# Patient Record
Sex: Male | Born: 1970 | Race: White | Hispanic: No | Marital: Married | Smoking: Former smoker
Health system: Southern US, Community
[De-identification: ages and names within clinical notes are randomized; demographics above are authoritative.]

## PROBLEM LIST (undated history)

## (undated) DIAGNOSIS — E119 Type 2 diabetes mellitus without complications: Secondary | ICD-10-CM

## (undated) DIAGNOSIS — F419 Anxiety disorder, unspecified: Secondary | ICD-10-CM

## (undated) DIAGNOSIS — I209 Angina pectoris, unspecified: Secondary | ICD-10-CM

## (undated) DIAGNOSIS — J45909 Unspecified asthma, uncomplicated: Secondary | ICD-10-CM

## (undated) HISTORY — PX: EYE SURGERY: SHX253

---

## 2000-06-30 ENCOUNTER — Encounter: Payer: Self-pay | Admitting: Internal Medicine

## 2000-06-30 ENCOUNTER — Emergency Department (HOSPITAL_COMMUNITY): Admission: EM | Admit: 2000-06-30 | Discharge: 2000-06-30 | Payer: Self-pay | Admitting: Internal Medicine

## 2010-11-08 ENCOUNTER — Ambulatory Visit: Payer: Self-pay

## 2012-10-08 ENCOUNTER — Emergency Department: Payer: Self-pay | Admitting: Emergency Medicine

## 2014-11-17 ENCOUNTER — Other Ambulatory Visit: Payer: Self-pay

## 2014-11-17 MED ORDER — FLUTICASONE-SALMETEROL 100-50 MCG/DOSE IN AEPB: 1.0000 | INHALATION_SPRAY | Freq: Two times a day (BID) | RESPIRATORY_TRACT | Status: DC

## 2014-11-17 NOTE — Telephone Encounter (Signed)
LAST VISIT: 05/10/2014  Request for Advair Diskus [id number].

## 2014-11-17 NOTE — Telephone Encounter (Signed)
Apt pe 

## 2014-11-18 NOTE — Telephone Encounter (Signed)
LM for pt to call and schedule appt. °

## 2014-12-27 ENCOUNTER — Other Ambulatory Visit: Payer: Self-pay

## 2014-12-27 MED ORDER — VENLAFAXINE HCL ER 225 MG PO TB24: 225.0000 mg | ORAL_TABLET | Freq: Every day | ORAL | Status: DC

## 2014-12-27 NOTE — Telephone Encounter (Signed)
Apt pe 

## 2014-12-27 NOTE — Telephone Encounter (Signed)
Last Visit: 05/10/2014 Practice Partner: [phone]  Request for Venlafaxine (Effexor) 225mg  tab.

## 2015-01-31 ENCOUNTER — Encounter: Payer: Self-pay | Admitting: Family Medicine

## 2015-04-12 ENCOUNTER — Ambulatory Visit
Admission: RE | Admit: 2015-04-12 | Discharge: 2015-04-12 | Disposition: A | Discharge status: Home / Self Care | Payer: Managed Care, Other (non HMO) | Source: Ambulatory Visit | Attending: Physician Assistant | Admitting: Physician Assistant

## 2015-04-12 ENCOUNTER — Encounter: Payer: Self-pay | Admitting: Physician Assistant

## 2015-04-12 ENCOUNTER — Ambulatory Visit: Payer: Self-pay | Admitting: Physician Assistant

## 2015-04-12 VITALS — BP 110/80 | HR 73 | Temp 98.5°F

## 2015-04-12 DIAGNOSIS — R0789 Other chest pain: Secondary | ICD-10-CM

## 2015-04-12 DIAGNOSIS — R079 Chest pain, unspecified: Secondary | ICD-10-CM | POA: Insufficient documentation

## 2015-04-12 NOTE — Progress Notes (Signed)
S: c/o left sided lung pain with deep cough/inspiration/and sneezing, feels like something is pressing in his lung, sx for 3 weeks, no sob, nonsmoker, hx of asthma, was in [place] and developed asthma after being there and breathing in a lot of sand, states they saw a spot on his lung a few years ago but the mri was neg; sees dr Meredeth Idefleming for pulmonology  O: vitals wnl, nad, lungs c t a, cv rrr, ribs nontender, abd nontender  A: chest pain on deep breath  P: cxr ap and lateral, refer to pulmonology if normal

## 2015-04-13 NOTE — Progress Notes (Signed)
I spoke with the patient about his xray results and he expressed understanding.  Patient will follow up with Dr. Meredeth IdeFleming, the pulmonologist at [facility].

## 2015-05-11 ENCOUNTER — Other Ambulatory Visit: Payer: Self-pay | Admitting: Physician Assistant

## 2015-05-12 NOTE — Telephone Encounter (Signed)
Med refill approved, pt has been on medication for long period of time without any difficulty

## 2015-07-15 ENCOUNTER — Encounter: Payer: Self-pay | Admitting: Physician Assistant

## 2015-07-15 ENCOUNTER — Ambulatory Visit: Payer: Self-pay | Admitting: Physician Assistant

## 2015-07-15 VITALS — BP 120/90 | HR 60 | Temp 98.4°F

## 2015-07-15 DIAGNOSIS — J018 Other acute sinusitis: Secondary | ICD-10-CM

## 2015-07-15 MED ORDER — AMOXICILLIN 875 MG PO TABS: 875.0000 mg | ORAL_TABLET | Freq: Two times a day (BID) | ORAL | Status: DC

## 2015-07-15 MED ORDER — FLUTICASONE PROPIONATE 50 MCG/ACT NA SUSP: 2.0000 | Freq: Every day | NASAL | Status: DC

## 2015-07-15 NOTE — Progress Notes (Signed)
S: C/o sore throat, chest and head congestion for 2 weeks, no fever, chills, cp/sob, v/d; mucus is green and thick, goes down the back of his throat,  cough is sporadic, c/o of facial and dental pain. Gets this every spring/summer and worried it'll get in his lungs bc he [place]  Using otc meds:   O: PE: vitals wnl, nad, perrl eomi, normocephalic, tms dull, nasal mucosa red and swollen, throat injected, neck supple no lymph, lungs c t a, cv rrr, neuro intact  A:  Acute sinusitis   P: amoxil 875mg  bid x 10d, flonase, drink fluids, continue regular meds , use otc meds of choice, return if not improving in 5 days, return earlier if worsening

## 2015-09-01 ENCOUNTER — Telehealth: Payer: Self-pay | Admitting: Family Medicine

## 2015-09-01 NOTE — Telephone Encounter (Signed)
FYI-Called to notify pt that he would need an appt to complete the paperwork for his disability but the number on file was incorrect. No message left. Called Angela Coy(spouse on HIPPA) no answer will try again.

## 2015-09-09 ENCOUNTER — Other Ambulatory Visit: Payer: Self-pay | Admitting: Physician Assistant

## 2015-09-09 NOTE — Telephone Encounter (Signed)
Med refill approved, pt has been on the medication for years for ptsd

## 2015-09-21 ENCOUNTER — Encounter: Payer: Self-pay | Admitting: Family Medicine

## 2015-09-21 ENCOUNTER — Ambulatory Visit (INDEPENDENT_AMBULATORY_CARE_PROVIDER_SITE_OTHER): Payer: Managed Care, Other (non HMO) | Admitting: Family Medicine

## 2015-09-21 ENCOUNTER — Ambulatory Visit: Payer: Managed Care, Other (non HMO) | Admitting: Family Medicine

## 2015-09-21 VITALS — BP 119/82 | HR 76 | Temp 98.1°F | Ht 70.5 in | Wt 234.0 lb

## 2015-09-21 DIAGNOSIS — F4312 Post-traumatic stress disorder, chronic: Secondary | ICD-10-CM

## 2015-09-21 NOTE — Progress Notes (Signed)
No visit done  patient for posttraumatic stress syndrome benefit [facility] requiring psychiatrist or psychologist fill out not a family physician.

## 2015-10-19 ENCOUNTER — Ambulatory Visit: Payer: Self-pay | Admitting: Physician Assistant

## 2015-10-19 ENCOUNTER — Other Ambulatory Visit: Payer: Self-pay | Admitting: Physician Assistant

## 2015-10-19 DIAGNOSIS — J4520 Mild intermittent asthma, uncomplicated: Secondary | ICD-10-CM

## 2015-10-19 MED ORDER — FLUTICASONE-SALMETEROL 100-50 MCG/DOSE IN AEPB: 1.0000 | INHALATION_SPRAY | Freq: Two times a day (BID) | RESPIRATORY_TRACT | 6 refills | Status: DC

## 2015-10-19 NOTE — Progress Notes (Addendum)
Sent rx in to pharmacy Pt not seen, stopped by clinic to get refill

## 2015-10-19 NOTE — Progress Notes (Deleted)
Pt requesting refill on advair, [facility]

## 2015-10-20 ENCOUNTER — Encounter: Payer: Self-pay | Admitting: Physician Assistant

## 2015-10-24 ENCOUNTER — Ambulatory Visit: Payer: Self-pay | Admitting: Physician Assistant

## 2015-10-24 ENCOUNTER — Encounter: Payer: Self-pay | Admitting: Physician Assistant

## 2015-10-24 ENCOUNTER — Ambulatory Visit
Admission: RE | Admit: 2015-10-24 | Discharge: 2015-10-24 | Disposition: A | Discharge status: Home / Self Care | Payer: Managed Care, Other (non HMO) | Source: Ambulatory Visit | Attending: Physician Assistant | Admitting: Physician Assistant

## 2015-10-24 VITALS — BP 120/85 | HR 75 | Temp 98.5°F

## 2015-10-24 DIAGNOSIS — R1010 Upper abdominal pain, unspecified: Secondary | ICD-10-CM | POA: Insufficient documentation

## 2015-10-24 DIAGNOSIS — I878 Other specified disorders of veins: Secondary | ICD-10-CM | POA: Diagnosis not present

## 2015-10-24 DIAGNOSIS — R109 Unspecified abdominal pain: Secondary | ICD-10-CM | POA: Diagnosis present

## 2015-10-24 MED ORDER — PROMETHAZINE HCL 25 MG PO TABS: 25.0000 mg | ORAL_TABLET | Freq: Three times a day (TID) | ORAL | 0 refills | Status: DC | PRN

## 2015-10-24 NOTE — Progress Notes (Signed)
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

## 2015-12-26 ENCOUNTER — Encounter: Payer: Self-pay | Admitting: Emergency Medicine

## 2015-12-26 ENCOUNTER — Observation Stay
Admission: EM | Admit: 2015-12-26 | Discharge: 2015-12-27 | Disposition: A | Discharge status: Home / Self Care | Payer: Managed Care, Other (non HMO) | Attending: Internal Medicine | Admitting: Internal Medicine

## 2015-12-26 ENCOUNTER — Emergency Department: Payer: Managed Care, Other (non HMO)

## 2015-12-26 DIAGNOSIS — G4733 Obstructive sleep apnea (adult) (pediatric): Secondary | ICD-10-CM | POA: Diagnosis not present

## 2015-12-26 DIAGNOSIS — G4700 Insomnia, unspecified: Secondary | ICD-10-CM | POA: Diagnosis not present

## 2015-12-26 DIAGNOSIS — Z7982 Long term (current) use of aspirin: Secondary | ICD-10-CM | POA: Insufficient documentation

## 2015-12-26 DIAGNOSIS — I10 Essential (primary) hypertension: Secondary | ICD-10-CM | POA: Insufficient documentation

## 2015-12-26 DIAGNOSIS — F419 Anxiety disorder, unspecified: Secondary | ICD-10-CM | POA: Diagnosis not present

## 2015-12-26 DIAGNOSIS — Z833 Family history of diabetes mellitus: Secondary | ICD-10-CM | POA: Insufficient documentation

## 2015-12-26 DIAGNOSIS — Z820 Family history of epilepsy and other diseases of the nervous system: Secondary | ICD-10-CM | POA: Diagnosis not present

## 2015-12-26 DIAGNOSIS — Z79899 Other long term (current) drug therapy: Secondary | ICD-10-CM | POA: Insufficient documentation

## 2015-12-26 DIAGNOSIS — R079 Chest pain, unspecified: Secondary | ICD-10-CM | POA: Diagnosis present

## 2015-12-26 DIAGNOSIS — I200 Unstable angina: Secondary | ICD-10-CM | POA: Diagnosis not present

## 2015-12-26 DIAGNOSIS — E785 Hyperlipidemia, unspecified: Secondary | ICD-10-CM | POA: Diagnosis not present

## 2015-12-26 DIAGNOSIS — F4310 Post-traumatic stress disorder, unspecified: Secondary | ICD-10-CM | POA: Insufficient documentation

## 2015-12-26 DIAGNOSIS — Z8379 Family history of other diseases of the digestive system: Secondary | ICD-10-CM | POA: Insufficient documentation

## 2015-12-26 DIAGNOSIS — J449 Chronic obstructive pulmonary disease, unspecified: Secondary | ICD-10-CM | POA: Insufficient documentation

## 2015-12-26 DIAGNOSIS — Z87891 Personal history of nicotine dependence: Secondary | ICD-10-CM | POA: Insufficient documentation

## 2015-12-26 DIAGNOSIS — I517 Cardiomegaly: Secondary | ICD-10-CM | POA: Insufficient documentation

## 2015-12-26 HISTORY — DX: Anxiety disorder, unspecified: F41.9

## 2015-12-26 LAB — BASIC METABOLIC PANEL
ANION GAP: 7 (ref 5–15)
BUN: 12 mg/dL (ref 6–20)
CALCIUM: 8.7 mg/dL — AB (ref 8.9–10.3)
CO2: 26 mmol/L (ref 22–32)
Chloride: 105 mmol/L (ref 101–111)
Creatinine, Ser: 1.03 mg/dL (ref 0.61–1.24)
GLUCOSE: 141 mg/dL — AB (ref 65–99)
Potassium: 3.5 mmol/L (ref 3.5–5.1)
Sodium: 138 mmol/L (ref 135–145)

## 2015-12-26 LAB — CBC
HCT: 43.1 % (ref 40.0–52.0)
HEMOGLOBIN: 14.7 g/dL (ref 13.0–18.0)
MCH: 30.1 pg (ref 26.0–34.0)
MCHC: 34.2 g/dL (ref 32.0–36.0)
MCV: 88 fL (ref 80.0–100.0)
PLATELETS: 257 10*3/uL (ref 150–440)
RBC: 4.89 MIL/uL (ref 4.40–5.90)
RDW: 13.1 % (ref 11.5–14.5)
WBC: 7.5 10*3/uL (ref 3.8–10.6)

## 2015-12-26 LAB — TROPONIN I: Troponin I: <0.03 ng/mL (ref <0.03)

## 2015-12-26 MED ORDER — QUETIAPINE FUMARATE 25 MG PO TABS
200.0000 mg | ORAL_TABLET | Freq: Every day | ORAL | Status: DC
  Administered 2015-12-26: 200 mg via ORAL
  Filled 2015-12-26: qty 8

## 2015-12-26 MED ORDER — ONDANSETRON HCL 4 MG/2ML IJ SOLN: 4.0000 mg | Freq: Four times a day (QID) | INTRAMUSCULAR | Status: DC | PRN

## 2015-12-26 MED ORDER — QUETIAPINE FUMARATE 25 MG PO TABS
200.0000 mg | ORAL_TABLET | Freq: Every day | ORAL | Status: DC
  Filled 2015-12-26: qty 8

## 2015-12-26 MED ORDER — LAMOTRIGINE 100 MG PO TABS
100.0000 mg | ORAL_TABLET | Freq: Two times a day (BID) | ORAL | Status: DC
  Administered 2015-12-26 – 2015-12-27 (×2): 100 mg via ORAL
  Filled 2015-12-26 (×2): qty 1

## 2015-12-26 MED ORDER — ASPIRIN 81 MG PO CHEW
324.0000 mg | CHEWABLE_TABLET | Freq: Once | ORAL | Status: AC
  Administered 2015-12-26: 324 mg via ORAL
  Filled 2015-12-26: qty 4

## 2015-12-26 MED ORDER — MOMETASONE FURO-FORMOTEROL FUM 100-5 MCG/ACT IN AERO
2.0000 | INHALATION_SPRAY | Freq: Two times a day (BID) | RESPIRATORY_TRACT | Status: DC
  Administered 2015-12-26 – 2015-12-27 (×2): 2 via RESPIRATORY_TRACT
  Filled 2015-12-26: qty 8.8

## 2015-12-26 MED ORDER — ASPIRIN 81 MG PO CHEW
CHEWABLE_TABLET | ORAL | Status: AC
  Filled 2015-12-26: qty 1

## 2015-12-26 MED ORDER — VENLAFAXINE HCL ER 75 MG PO CP24
75.0000 mg | ORAL_CAPSULE | Freq: Every day | ORAL | Status: DC
  Administered 2015-12-26: 75 mg via ORAL
  Filled 2015-12-26: qty 1

## 2015-12-26 MED ORDER — INFLUENZA VAC SPLIT QUAD 0.5 ML IM SUSY
0.5000 mL | PREFILLED_SYRINGE | INTRAMUSCULAR | Status: AC
  Administered 2015-12-27: 0.5 mL via INTRAMUSCULAR
  Filled 2015-12-26: qty 0.5

## 2015-12-26 MED ORDER — ACETAMINOPHEN 325 MG PO TABS
650.0000 mg | ORAL_TABLET | ORAL | Status: DC | PRN
  Administered 2015-12-27: 650 mg via ORAL
  Filled 2015-12-26: qty 2

## 2015-12-26 MED ORDER — ENOXAPARIN SODIUM 40 MG/0.4ML ~~LOC~~ SOLN
40.0000 mg | SUBCUTANEOUS | Status: DC
  Administered 2015-12-26: 40 mg via SUBCUTANEOUS
  Filled 2015-12-26: qty 0.4

## 2015-12-26 NOTE — Progress Notes (Signed)
Admitted from the ED. C/o chest tightness and dizziness, tingling sensation in the lips, knees are shaking.

## 2015-12-26 NOTE — H&P (Signed)
[facility] [place] [facility]   PATIENT NAME: Tyler ArbourJeff Deleon    MR#:  [id number]  DATE OF BIRTH:  Aug 26, 1970  DATE OF ADMISSION:  12/26/2015  PRIMARY CARE PHYSICIAN: Greig RightSusan Fisher, PA-C   REQUESTING/REFERRING PHYSICIAN: dr Don Perkingveronese  CHIEF COMPLAINT:   Chest painOn and off for 1 week HISTORY OF PRESENT ILLNESS:  Tyler Deleon  is a 45 y.o. male with a known history of PTSD comes to the emergency room with chest pain and left lateral radiating to the neck for 1 week. Patient thought initially it was a muscle pull however the pain persisted came back and came to the emergency room for further evaluation. In the ER facet of cardiac enzyme was negative EKG shows normal sinus rhythm. Patient denies doing any heavy lifting or any pushing. Patient had normal Myoview stress test in 2015.  PAST MEDICAL HISTORY:   Past Medical History:  Diagnosis Date  . Anxiety     PAST SURGICAL HISTOIRY:   Past Surgical History:  Procedure Laterality Date  . EYE SURGERY Bilateral    cataract removals    SOCIAL HISTORY:   Social History  Substance Use Topics  . Smoking status: Former Smoker    Types: Cigarettes  . Smokeless tobacco: Current User    Types: Snuff     Comment: was an occasional smoker, never a pack a day smoker  . Alcohol use No    FAMILY HISTORY:   Family History  Problem Relation Age of Onset  . Diabetes Mother   . Hepatitis C Mother   . Multiple sclerosis Mother     DRUG ALLERGIES:   Allergies  Allergen Reactions  . Bupropion Other (See Comments)    Increased anger     REVIEW OF SYSTEMS:  Review of Systems  Constitutional: Negative for chills, fever and weight loss.  HENT: Negative for ear discharge, ear pain and nosebleeds.   Eyes: Negative for blurred vision, pain and discharge.  Respiratory: Negative for sputum production, shortness of breath, wheezing and stridor.   Cardiovascular: Positive for chest pain. Negative for  palpitations, orthopnea and PND.  Gastrointestinal: Negative for abdominal pain, diarrhea, nausea and vomiting.  Genitourinary: Negative for frequency and urgency.  Musculoskeletal: Negative for back pain and joint pain.  Neurological: Negative for sensory change, speech change, focal weakness and weakness.  Psychiatric/Behavioral: Negative for depression and hallucinations. The patient is not nervous/anxious.      MEDICATIONS AT HOME:   Prior to Admission medications   Medication Sig Start Date End Date Taking? Authorizing Provider  Fluticasone-Salmeterol (ADVAIR DISKUS) 100-50 MCG/DOSE AEPB Inhale 1 puff into the lungs 2 (two) times daily. 10/19/15  Yes Faythe GheeSusan W Fisher, PA-C  lamoTRIgine (LAMICTAL) 100 MG tablet Take 100 mg by mouth 2 (two) times daily.   Yes Historical Provider, MD  QUEtiapine (SEROQUEL) 200 MG tablet TAKE ONE (1) TABLET BY MOUTH EVERY DAY 05/12/15  Yes Faythe GheeSusan W Fisher, PA-C  venlafaxine XR (EFFEXOR-XR) 75 MG 24 hr capsule TAKE THREE CAPSULES BY MOUTH DAILY AS DIRECTED 09/09/15  Yes Faythe GheeSusan W Fisher, PA-C      VITAL SIGNS:  Blood pressure (!) 135/95, pulse 62, temperature 98.2 F (36.8 C), temperature source Oral, resp. rate 16, height 5\' 11"  (1.803 m), weight 105.7 kg (233 lb), SpO2 98 %.  PHYSICAL EXAMINATION:  GENERAL:  45 y.o.-year-old patient lying in the bed with no acute distress.  EYES: Pupils equal, round, reactive to light and accommodation. No scleral icterus. Extraocular muscles  intact.  HEENT: Head atraumatic, normocephalic. Oropharynx and nasopharynx clear.  NECK:  Supple, no jugular venous distention. No thyroid enlargement, no tenderness.  LUNGS: Normal breath sounds bilaterally, no wheezing, rales,rhonchi or crepitation. No use of accessory muscles of respiration.  CARDIOVASCULAR: S1, S2 normal. No murmurs, rubs, or gallops.  ABDOMEN: Soft, nontender, nondistended. Bowel sounds present. No organomegaly or mass.  EXTREMITIES: No pedal edema, cyanosis, or  clubbing.  NEUROLOGIC: Cranial nerves II through XII are intact. Muscle strength 5/5 in all extremities. Sensation intact. Gait not checked.  PSYCHIATRIC: patient is alert and oriented x 3.  SKIN: No obvious rash, lesion, or ulcer.   LABORATORY PANEL:   CBC  Recent Labs Lab 12/26/15 1012  WBC 7.5  HGB 14.7  HCT 43.1  PLT 257   ------------------------------------------------------------------------------------------------------------------  Chemistries   Recent Labs Lab 12/26/15 1012  NA 138  K 3.5  CL 105  CO2 26  GLUCOSE 141*  BUN 12  CREATININE 1.03  CALCIUM 8.7*   ------------------------------------------------------------------------------------------------------------------  Cardiac Enzymes  Recent Labs Lab 12/26/15 1012  TROPONINI <0.03   ------------------------------------------------------------------------------------------------------------------  RADIOLOGY:  Dg Chest 2 View  Result Date: 12/26/2015 CLINICAL DATA:  Chest pain. EXAM: CHEST  2 VIEW COMPARISON:  04/12/2015. FINDINGS: Mediastinum and hilar structures are normal. No focal infiltrate. Cardiomegaly with normal pulmonary vascularity. No pleural effusion or pneumothorax. IMPRESSION: No acute cardiopulmonary disease. Electronically Signed   By: Maisie Fushomas  Register   On: 12/26/2015 10:48    EKG:  Normal sinus rhythm  IMPRESSION AND PLAN:  Tyler Deleon  is a 45 y.o. male with a known history of PTSD comes to the emergency room with chest pain and left lateral radiating to the neck for 1 week. Patient thought initially it was a muscle pull however the pain persisted came back and came to the emergency room for further evaluation. In the ER facet of cardiac enzyme was negative EKG shows normal sinus rhythm.  1. Chest pain rule out MI -Admit to telemetry -Cycle cardiac enzymes 3. Cardiology consultation. -aspirin when necessary nitroglycerin  2. History of PTSD Continue home meds  3. DVT  prophylaxis subcutaneous Lovenox  Case was discussed with cardiology Dr. Kirke CorinArida     All the records are reviewed and case discussed with ED provider. Management plans discussed with the patient, family and they are in agreement.  CODE STATUS: full  TOTAL TIME TAKING CARE OF THIS PATIENT: 50 minutes.    Jamaris Theard M.D on 12/26/2015 at 3:43 PM  Between 7am to 6pm - Pager - [phone]  After 6pm go to [email or web address] - password [email or web address] [facility]  [facility]  Office  [phone]  CC: Primary care physician; Greig RightSusan Fisher, PA-C

## 2015-12-26 NOTE — ED Notes (Signed)
Pt dropped 1 Asprin into floor, override from pyxis

## 2015-12-26 NOTE — ED Triage Notes (Signed)
Pt started with left chest pain 1 week ago. Has also had SHOB, nausea, sweating, and fatigue. No distress at this time. Skin dry at this time.

## 2015-12-26 NOTE — Progress Notes (Signed)
States he's short of breath from just moving in the bed.

## 2015-12-26 NOTE — ED Provider Notes (Signed)
[facility] Emergency Department Provider Note  ____________________________________________  Time seen: Approximately 2:59 PM  I have reviewed the triage vital signs and the nursing notes.   HISTORY  Chief Complaint Chest Pain   HPI Tyler Deleon is a 45 y.o. male no significant past medical history who presents for evaluation of chest pain. Patient reports that for the last month he has been having progressively longer and worse episodes of chest pain on exertion. Over the course of the last few days he reports that the chest pain comes on with minimal exertion such as walking to and from his car. Describes as a tightness, located substernally, nonradiating, associated with dizziness, shortness of breath, nausea, diaphoresis. His last episode was 3 days ago. Patient has a history of ischemic heart disease on his grandmother, patient is not a smoker. He denies any chest pain at this time. He does report that he felt short of breath walking from triage to the room. He denies any recent illness, he denies coughing. Patient was seen by cardiology in 2015 for similar episodes and at that time had a stress test that was negative. Patient never underwent a left heart catheterization.  Past Medical History:  Diagnosis Date  . Anxiety     There are no active problems to display for this patient.   Past Surgical History:  Procedure Laterality Date  . EYE SURGERY Bilateral    cataract removals    Prior to Admission medications   Medication Sig Start Date End Date Taking? Authorizing Provider  Fluticasone-Salmeterol (ADVAIR DISKUS) 100-50 MCG/DOSE AEPB Inhale 1 puff into the lungs 2 (two) times daily. 10/19/15   Faythe GheeSusan W Fisher, PA-C  lamoTRIgine (LAMICTAL) 100 MG tablet Take 100 mg by mouth 2 (two) times daily.    Historical Provider, MD  promethazine (PHENERGAN) 25 MG tablet Take 1 tablet (25 mg total) by mouth every 8 (eight) hours as needed for nausea or vomiting.  10/24/15   Faythe GheeSusan W Fisher, PA-C  QUEtiapine (SEROQUEL) 200 MG tablet TAKE ONE (1) TABLET BY MOUTH EVERY DAY 05/12/15   Faythe GheeSusan W Fisher, PA-C  venlafaxine XR (EFFEXOR-XR) 75 MG 24 hr capsule TAKE THREE CAPSULES BY MOUTH DAILY AS DIRECTED 09/09/15   Faythe GheeSusan W Fisher, PA-C    Allergies Bupropion  Family History  Problem Relation Age of Onset  . Diabetes Mother   . Hepatitis C Mother   . Multiple sclerosis Mother     Social History Social History  Substance Use Topics  . Smoking status: Former Smoker    Types: Cigarettes  . Smokeless tobacco: Current User    Types: Snuff     Comment: was an occasional smoker, never a pack a day smoker  . Alcohol use No    Review of Systems  Constitutional: Negative for fever. Eyes: Negative for visual changes. ENT: Negative for sore throat. Neck: No neck pain  Cardiovascular: + chest pain and diaphoresis Respiratory: Negative for shortness of breath. Gastrointestinal: Negative for abdominal pain, vomiting or diarrhea. Genitourinary: Negative for dysuria. Musculoskeletal: Negative for back pain. Skin: Negative for rash. Neurological: Negative for headaches, weakness or numbness. Psych: No SI or HI  ____________________________________________   PHYSICAL EXAM:  VITAL SIGNS: ED Triage Vitals  Enc Vitals Group     BP 12/26/15 1011 (!) 144/89     Pulse Rate 12/26/15 1011 75     Resp 12/26/15 1011 18     Temp 12/26/15 1011 98.2 F (36.8 C)  Temp Source 12/26/15 1011 Oral     SpO2 12/26/15 1011 97 %     Weight 12/26/15 1007 233 lb (105.7 kg)     Height 12/26/15 1007 5\' 11"  (1.803 m)     Head Circumference --      Peak Flow --      Pain Score 12/26/15 1007 2     Pain Loc --      Pain Edu? --      Excl. in GC? --     Constitutional: Alert and oriented. Well appearing and in no apparent distress. HEENT:      Head: Normocephalic and atraumatic.         Eyes: Conjunctivae are normal. Sclera is non-icteric. EOMI. PERRL       Mouth/Throat: Mucous membranes are moist.       Neck: Supple with no signs of meningismus. Cardiovascular: Regular rate and rhythm. No murmurs, gallops, or rubs. 2+ symmetrical distal pulses are present in all extremities. No JVD. Respiratory: Normal respiratory effort. Lungs are clear to auscultation bilaterally. No wheezes, crackles, or rhonchi.  Gastrointestinal: Soft, non tender, and non distended with positive bowel sounds. No rebound or guarding. Musculoskeletal: Nontender with normal range of motion in all extremities. No edema, cyanosis, or erythema of extremities. Neurologic: Normal speech and language. Face is symmetric. Moving all extremities. No gross focal neurologic deficits are appreciated. Skin: Skin is warm, dry and intact. No rash noted. Psychiatric: Mood and affect are normal. Speech and behavior are normal.  ____________________________________________   LABS (all labs ordered are listed, but only abnormal results are displayed)  Labs Reviewed  BASIC METABOLIC PANEL - Abnormal; Notable for the following:       Result Value   Glucose, Bld 141 (*)    Calcium 8.7 (*)    All other components within normal limits  CBC  TROPONIN I  TROPONIN I   ____________________________________________  EKG  ED ECG REPORT I, Nita Sicklearolina Kelii Chittum, the attending physician, personally viewed and interpreted this ECG.  Normal sinus rhythm, rate of 76, normal intervals, normal axis, no ST elevations or depressions. Normal EKG. ____________________________________________  RADIOLOGY  CXR:  Negative ____________________________________________   PROCEDURES  Procedure(s) performed: None Procedures Critical Care performed:  None ____________________________________________   INITIAL IMPRESSION / ASSESSMENT AND PLAN / ED COURSE  45 y.o. male no significant past medical history who presents for evaluation of multiple exertional episodes of chest tightness associated with  diaphoresis, nausea, and SOB. No episodes at rest. Heart score of 4. No pain at this time. Presentation concerning for unstable angina with increase in frequency of episodes. EKG non ischemic, 1st troponin is negative. Will give full dose ASA and admit for chest pain work up.   Clinical Course     Pertinent labs & imaging results that were available during my care of the patient were reviewed by me and considered in my medical decision making (see chart for details).    ____________________________________________   FINAL CLINICAL IMPRESSION(S) / ED DIAGNOSES  Final diagnoses:  Unstable angina (HCC)      NEW MEDICATIONS STARTED DURING THIS VISIT:  New Prescriptions   No medications on file     Note:  This document was prepared using Dragon voice recognition software and may include unintentional dictation errors.    Nita Sicklearolina Tresean Mattix, MD 12/26/15 1504

## 2015-12-27 ENCOUNTER — Observation Stay (HOSPITAL_BASED_OUTPATIENT_CLINIC_OR_DEPARTMENT_OTHER)
Admit: 2015-12-27 | Discharge: 2015-12-27 | Disposition: A | Discharge status: Home / Self Care | Payer: Managed Care, Other (non HMO) | Attending: Internal Medicine | Admitting: Internal Medicine

## 2015-12-27 ENCOUNTER — Encounter
Admission: EM | Disposition: A | Discharge status: Home / Self Care | Payer: Self-pay | Source: Home / Self Care | Attending: Emergency Medicine

## 2015-12-27 ENCOUNTER — Other Ambulatory Visit: Payer: Managed Care, Other (non HMO)

## 2015-12-27 DIAGNOSIS — I200 Unstable angina: Principal | ICD-10-CM

## 2015-12-27 DIAGNOSIS — R079 Chest pain, unspecified: Secondary | ICD-10-CM | POA: Diagnosis not present

## 2015-12-27 HISTORY — PX: CARDIAC CATHETERIZATION: SHX172

## 2015-12-27 LAB — LIPID PANEL
Cholesterol: 171 mg/dL (ref 0–200)
HDL: 37 mg/dL — ABNORMAL LOW (ref >40)
LDL CALC: 108 mg/dL — AB (ref 0–99)
Total CHOL/HDL Ratio: 4.6 RATIO
Triglycerides: 128 mg/dL (ref <150)
VLDL: 26 mg/dL (ref 0–40)

## 2015-12-27 LAB — TROPONIN I

## 2015-12-27 LAB — ECHOCARDIOGRAM COMPLETE
HEIGHTINCHES: 71 in
WEIGHTICAEL: 3772.8 [oz_av]

## 2015-12-27 SURGERY — LEFT HEART CATH AND CORONARY ANGIOGRAPHY
Anesthesia: Moderate Sedation

## 2015-12-27 MED ORDER — MIDAZOLAM HCL 2 MG/2ML IJ SOLN
INTRAMUSCULAR | Status: AC
  Filled 2015-12-27: qty 2

## 2015-12-27 MED ORDER — ASPIRIN 81 MG PO CHEW: 81.0000 mg | CHEWABLE_TABLET | ORAL | Status: DC

## 2015-12-27 MED ORDER — SODIUM CHLORIDE 0.9% FLUSH: 3.0000 mL | INTRAVENOUS | Status: DC | PRN

## 2015-12-27 MED ORDER — SODIUM CHLORIDE 0.9 % WEIGHT BASED INFUSION: 3.0000 mL/kg/h | INTRAVENOUS | Status: DC

## 2015-12-27 MED ORDER — ASPIRIN 81 MG PO CHEW: 81.0000 mg | CHEWABLE_TABLET | Freq: Every day | ORAL | 0 refills | Status: DC

## 2015-12-27 MED ORDER — VERAPAMIL HCL 2.5 MG/ML IV SOLN
INTRAVENOUS | Status: DC | PRN
  Administered 2015-12-27: 2.5 mg via INTRAVENOUS

## 2015-12-27 MED ORDER — ISOSORBIDE MONONITRATE ER 30 MG PO TB24
30.0000 mg | ORAL_TABLET | Freq: Every day | ORAL | Status: DC
  Administered 2015-12-27: 30 mg via ORAL
  Filled 2015-12-27: qty 1

## 2015-12-27 MED ORDER — FUROSEMIDE 20 MG PO TABS: 20.0000 mg | ORAL_TABLET | Freq: Every day | ORAL | 0 refills | Status: DC

## 2015-12-27 MED ORDER — SODIUM CHLORIDE 0.9% FLUSH: 3.0000 mL | Freq: Two times a day (BID) | INTRAVENOUS | Status: DC

## 2015-12-27 MED ORDER — FENTANYL CITRATE (PF) 100 MCG/2ML IJ SOLN
INTRAMUSCULAR | Status: DC | PRN
  Administered 2015-12-27: 50 ug via INTRAVENOUS

## 2015-12-27 MED ORDER — ISOSORBIDE MONONITRATE ER 30 MG PO TB24: 30.0000 mg | ORAL_TABLET | Freq: Every day | ORAL | 0 refills | Status: DC

## 2015-12-27 MED ORDER — FENTANYL CITRATE (PF) 100 MCG/2ML IJ SOLN
INTRAMUSCULAR | Status: AC
  Filled 2015-12-27: qty 2

## 2015-12-27 MED ORDER — SODIUM CHLORIDE 0.9 % IV SOLN: 250.0000 mL | INTRAVENOUS | Status: DC | PRN

## 2015-12-27 MED ORDER — ASPIRIN 81 MG PO CHEW
CHEWABLE_TABLET | ORAL | Status: AC
  Filled 2015-12-27: qty 1

## 2015-12-27 MED ORDER — ASPIRIN 81 MG PO CHEW
81.0000 mg | CHEWABLE_TABLET | Freq: Every day | ORAL | Status: DC
  Administered 2015-12-27: 81 mg via ORAL
  Filled 2015-12-27: qty 1

## 2015-12-27 MED ORDER — SODIUM CHLORIDE 0.9 % IV SOLN
250.0000 mL | INTRAVENOUS | Status: DC | PRN
  Administered 2015-12-27: 250 mL via INTRAVENOUS

## 2015-12-27 MED ORDER — HEPARIN SODIUM (PORCINE) 1000 UNIT/ML IJ SOLN
INTRAMUSCULAR | Status: DC | PRN
  Administered 2015-12-27: 5000 [IU] via INTRAVENOUS

## 2015-12-27 MED ORDER — VENLAFAXINE HCL ER 75 MG PO CP24: 225.0000 mg | ORAL_CAPSULE | Freq: Every day | ORAL | Status: DC

## 2015-12-27 MED ORDER — HEPARIN (PORCINE) IN NACL 2-0.9 UNIT/ML-% IJ SOLN
INTRAMUSCULAR | Status: AC
  Filled 2015-12-27: qty 1000

## 2015-12-27 MED ORDER — HEPARIN SODIUM (PORCINE) 1000 UNIT/ML IJ SOLN
INTRAMUSCULAR | Status: AC
  Filled 2015-12-27: qty 1

## 2015-12-27 MED ORDER — MIDAZOLAM HCL 2 MG/2ML IJ SOLN
INTRAMUSCULAR | Status: DC | PRN
Start: 2015-12-27 — End: 2015-12-27
  Administered 2015-12-27 (×2): 1 mg via INTRAVENOUS

## 2015-12-27 MED ORDER — IOPAMIDOL (ISOVUE-300) INJECTION 61%
INTRAVENOUS | Status: DC | PRN
  Administered 2015-12-27: 75 mL via INTRA_ARTERIAL

## 2015-12-27 MED ORDER — SODIUM CHLORIDE 0.9 % WEIGHT BASED INFUSION: 1.0000 mL/kg/h | INTRAVENOUS | Status: DC

## 2015-12-27 MED ORDER — ASPIRIN 81 MG PO CHEW
CHEWABLE_TABLET | ORAL | Status: DC | PRN
  Administered 2015-12-27: 81 mg via ORAL

## 2015-12-27 MED ORDER — SODIUM CHLORIDE 0.9 % IV SOLN
INTRAVENOUS | Status: DC
  Administered 2015-12-27: 15:00:00 via INTRAVENOUS

## 2015-12-27 MED ORDER — VERAPAMIL HCL 2.5 MG/ML IV SOLN
INTRAVENOUS | Status: AC
  Filled 2015-12-27: qty 2

## 2015-12-27 SURGICAL SUPPLY — 6 items
CATH 5F 110X4 TIG (CATHETERS) ×3 IMPLANT
CATH INFINITI 5FR ANG PIGTAIL (CATHETERS) ×3 IMPLANT
DEVICE RAD TR BAND REGULAR (VASCULAR PRODUCTS) ×3 IMPLANT
GLIDESHEATH SLEND SS 6F .021 (SHEATH) ×3 IMPLANT
KIT MANI 3VAL PERCEP (MISCELLANEOUS) ×3 IMPLANT
PACK CARDIAC CATH (CUSTOM PROCEDURE TRAY) ×3 IMPLANT

## 2015-12-27 NOTE — Progress Notes (Signed)
Pt states he wears a CPAP at home to sleep. No orders in, MD pagde, Dr. Emmit PomfretHugelmeyer to put in CPAP order. No further concerns, will continue to monitor.Shirley FriarAlexis Miller, RN, BSN

## 2015-12-27 NOTE — Progress Notes (Signed)
*  PRELIMINARY RESULTS* Echocardiogram 2D Echocardiogram has been performed.  Cristela BlueHege, Erminio Nygard 12/27/2015, 1:37 PM

## 2015-12-27 NOTE — Progress Notes (Signed)
Report to [place] tele[place].  Keep right wrist elevated on pillow above the heart for today.  Watch right wrist for evidence of bleeding or hematoma.. If bleeding or hematoma noted, hold pressure over the site for at least 15 minutes and notify the physician.  No blending or flexing of the wrist--no lifting for the remainder of the day or for 2 weeks after your procedure. Notify the physician for evidence of infection or if you get a temperature.

## 2015-12-27 NOTE — Consult Note (Signed)
Cardiology Consultation Note  Patient ID: Tyler Deleon, MRN: [id number], DOB/AGE: 1970/08/22 45 y.o. Admit date: 12/26/2015   Date of Consult: 12/27/2015 Primary Physician: Greig RightSusan Fisher, PA-C Primary Cardiologist: [facility] Requesting Physician: Dr. Allena KatzPatel, MD  Chief Complaint: Chest pain Reason for Consult: Same  HPI: 45 y.o. male with h/o HLD, mild COPD based on PFTs in 2015, prior tobacco abuse x "several years" at 0.5 ppd quitting 10 years ago, OSA on CPAP, insomnia, PTSD, and anxiety who presented to the ED on 12/12 with chest pain x 1 week.   Patient previously followed by Dr. Gwen PoundsKowalski, MD, lasting seeing him in 2015. At that time he was being evaluated for exertional SOB. Workup included a stress test that showed reasonable exercise tolerance without evidence of myocardial ischemia or evidence of decreased exercise tolerance. He did have SOB at peak exercise without evidence of chest discomfort. There were no rhythm disturbances. He was previously in the [place] war and currently works in the [employer].   Patient reported chest pain x 1 week that he thought was a pulled muscle. Pain episodes have been progressively getting longer in duration. Pain is exertional. Over the prior couple of days his pain will now come on with minimal exertion such as ambulating to his car in a parking lot. Pain is described as a tightness, located along left-lateral chest wall and radiates substernally and to the neck and left shoulder. It is associated with SOB, nausea, and diaphoresis. Upon his arrival he was found to have negative troponin x 4, SCr 1.03, K+ 3.5, unremarkable CBC, negative CXR, EKG showed NSR, 76 bpm, no acute st/t changes. He was admitted, given ASA, and cardiology was consulted. Currently without chest pain, his last episode was 3 days, though he has persistently had SOB and fatigue.      Past Medical History:  Diagnosis Date  . Anxiety       Most Recent Cardiac  Studies: As above   Surgical History:  Past Surgical History:  Procedure Laterality Date  . EYE SURGERY Bilateral    cataract removals     Home Meds: Prior to Admission medications   Medication Sig Start Date End Date Taking? Authorizing Provider  Fluticasone-Salmeterol (ADVAIR DISKUS) 100-50 MCG/DOSE AEPB Inhale 1 puff into the lungs 2 (two) times daily. 10/19/15  Yes Faythe GheeSusan W Fisher, PA-C  lamoTRIgine (LAMICTAL) 100 MG tablet Take 100 mg by mouth 2 (two) times daily.   Yes Historical Provider, MD  QUEtiapine (SEROQUEL) 200 MG tablet TAKE ONE (1) TABLET BY MOUTH EVERY DAY 05/12/15  Yes Faythe GheeSusan W Fisher, PA-C  venlafaxine XR (EFFEXOR-XR) 75 MG 24 hr capsule TAKE THREE CAPSULES BY MOUTH DAILY AS DIRECTED 09/09/15  Yes Faythe GheeSusan W Fisher, PA-C    Inpatient Medications:  . enoxaparin (LOVENOX) injection  40 mg Subcutaneous Q24H  . Influenza vac split quadrivalent PF  0.5 mL Intramuscular Tomorrow-1000  . lamoTRIgine  100 mg Oral BID  . mometasone-formoterol  2 puff Inhalation BID  . QUEtiapine  200 mg Oral QHS  . venlafaxine XR  75 mg Oral Q breakfast     Allergies:  Allergies  Allergen Reactions  . Bupropion Other (See Comments)    Increased anger     Social History   Social History  . Marital status: Married    Spouse name: N/A  . Number of children: N/A  . Years of education: N/A   Occupational History  . Not on file.   Social History  Main Topics  . Smoking status: Former Smoker    Types: Cigarettes  . Smokeless tobacco: Current User    Types: Snuff     Comment: was an occasional smoker, never a pack a day smoker  . Alcohol use No  . Drug use: No  . Sexual activity: Not on file   Other Topics Concern  . Not on file   Social History Narrative  . No narrative on file     Family History  Problem Relation Age of Onset  . Diabetes Mother   . Hepatitis C Mother   . Multiple sclerosis Mother      Review of Systems: Review of Systems  Constitutional: Positive  for diaphoresis and malaise/fatigue. Negative for chills, fever and weight loss.  HENT: Negative for congestion.   Eyes: Negative for discharge and redness.  Respiratory: Positive for shortness of breath. Negative for cough, hemoptysis, sputum production and wheezing.   Cardiovascular: Positive for chest pain. Negative for palpitations, orthopnea, claudication, leg swelling and PND.  Gastrointestinal: Positive for nausea. Negative for abdominal pain, blood in stool, heartburn, melena and vomiting.  Genitourinary: Negative for hematuria.  Musculoskeletal: Negative for falls and myalgias.  Skin: Negative for rash.  Neurological: Positive for weakness. Negative for dizziness, tingling, tremors, sensory change, speech change, focal weakness and loss of consciousness.  Endo/Heme/Allergies: Does not bruise/bleed easily.  Psychiatric/Behavioral: Negative for substance abuse. The patient is nervous/anxious.   All other systems reviewed and are negative.   Labs:  Recent Labs  12/26/15 1451 12/26/15 1729 12/26/15 2334 12/27/15 0529  TROPONINI <0.03 <0.03 <0.03 <0.03   Lab Results  Component Value Date   WBC 7.5 12/26/2015   HGB 14.7 12/26/2015   HCT 43.1 12/26/2015   MCV 88.0 12/26/2015   PLT 257 12/26/2015     Recent Labs Lab 12/26/15 1012  NA 138  K 3.5  CL 105  CO2 26  BUN 12  CREATININE 1.03  CALCIUM 8.7*  GLUCOSE 141*   No results found for: CHOL, HDL, LDLCALC, TRIG No results found for: DDIMER  Radiology/Studies:  Dg Chest 2 View  Result Date: 12/26/2015 CLINICAL DATA:  Chest pain. EXAM: CHEST  2 VIEW COMPARISON:  04/12/2015. FINDINGS: Mediastinum and hilar structures are normal. No focal infiltrate. Cardiomegaly with normal pulmonary vascularity. No pleural effusion or pneumothorax. IMPRESSION: No acute cardiopulmonary disease. Electronically Signed   By: Maisie Fus  Register   On: 12/26/2015 10:48    EKG: Interpreted by me showed:  NSR, 76 bpm, no acute st/t  changes Telemetry: Interpreted by me showed: NSR, 70's bpm  Weights: Filed Weights   12/26/15 1007 12/26/15 1807  Weight: 233 lb (105.7 kg) 235 lb 12.8 oz (107 kg)     Physical Exam: Blood pressure 111/78, pulse 65, temperature 97.8 F (36.6 C), temperature source Oral, resp. rate 18, height 5\' 11"  (1.803 m), weight 235 lb 12.8 oz (107 kg), SpO2 97 %. Body mass index is 32.89 kg/m. General: Well developed, well nourished, in no acute distress. Head: Normocephalic, atraumatic, sclera non-icteric, no xanthomas, nares are without discharge.  Neck: Negative for carotid bruits. JVD not elevated. Lungs: Clear bilaterally to auscultation without wheezes, rales, or rhonchi. Breathing is unlabored. Heart: RRR with S1 S2. No murmurs, rubs, or gallops appreciated. Abdomen: Soft, non-tender, non-distended with normoactive bowel sounds. No hepatomegaly. No rebound/guarding. No obvious abdominal masses. Msk:  Strength and tone appear normal for age. Extremities: No clubbing or cyanosis. No edema. Distal pedal pulses are 2+ and equal bilaterally.  Neuro: Alert and oriented X 3. No facial asymmetry. No focal deficit. Moves all extremities spontaneously. Psych:  Responds to questions appropriately with a normal affect.    Assessment and Plan:  Principal Problem:   Unstable angina (HCC)    1. Unstable angina: -Currently chest pain free -Ruled out -Given his worsening exertional symptoms he wants cardiac cath and not stress test -Schedule for cardiac cath with Dr. Okey DupreEnd today -Risks and benefits of cardiac catheterization have been discussed with the patient including risks of bleeding, bruising, infection, kidney damage, stroke, heart attack, and death. The patient understands these risks and is willing to proceed with the procedure. All questions have been answered and concerns listened to -ASA -Add Lipid and A1C for further risk stratification  2. PTSD/anxiety: -Stable -Per  IM   Signed, Eula Listenyan Cordarryl Monrreal, PA-C [facility] Pager: [phone] 12/27/2015, 8:06 AM

## 2015-12-27 NOTE — Progress Notes (Signed)
    Checked on patient s/p cardiac cath. No further chest pain. No issues with cardiac cath site. Discussed with IM. Will place on Imdur and Lasix. Outpatient follow up.

## 2015-12-27 NOTE — Brief Op Note (Signed)
Brief Cardiac Catheterization Note (Full Report to Follow)  Date: 12/27/2015 Time: 12:32 PM  PATIENT:  Loyola MastJeff A Zufall  45 y.o. male  PRE-OPERATIVE DIAGNOSIS:  unstable angina  POST-OPERATIVE DIAGNOSIS:  same  PROCEDURE:  Procedure(s): Left Heart Cath and Coronary Angiography (N/A)  SURGEON:  Surgeon(s) and Role:    * Yvonne Kendallhristopher Diara Chaudhari, MD - Primary  FINDINGS: 1.  No angiographically significant coronary artery disease. 2.  Normal left ventricular contraction. 3.  Mildly elevated left ventricular filling pressure.  RECOMMENDATIONS: 1. Start isosorbide mononitrate for possible microvascular dysfunction. 2. Transthoracic echocardiogram to evaluate for other structural abnormalities that may be contributing to chest pain and shortness of breath.  Yvonne Kendallhristopher Faust Thorington, MD [facility] Pager: [phone]

## 2015-12-27 NOTE — Progress Notes (Signed)
Discharged home with wife.  Cath site is clean and dry.  Pulses are excellent.  Instructions regarding site care given.  Instructions regarding life style changes to improve heart condition given.  Low salt, low fat diet and exercise.  Handouts for new medications given.

## 2015-12-27 NOTE — Interval H&P Note (Signed)
History and Physical Interval Note:  12/27/2015 11:54 AM  Tyler Deleon  has presented today for cardiac catheterization, with the diagnosis of unstable angina  The various methods of treatment have been discussed with the patient and family. After consideration of risks, benefits and other options for treatment, the patient has consented to  Procedure(s): Left Heart Cath and Coronary Angiography (N/A) as a surgical intervention .  The patient's history has been reviewed, patient examined, no change in status, stable for surgery.  I have reviewed the patient's chart and labs.  Questions were answered to the patient's satisfaction.    Cath Lab Visit (complete for each Cath Lab visit)  Clinical Evaluation Leading to the Procedure:   ACS: Yes.    Non-ACS:    Anginal Classification: CCS IV  Anti-ischemic medical therapy: No Therapy  Non-Invasive Test Results: No non-invasive testing performed  Prior CABG: No previous CABG  [name]

## 2015-12-27 NOTE — Discharge Instructions (Signed)
Heart Disease Prevention °Heart disease is a leading cause of death. There are many things you can do to help prevent heart disease. °Be physically active °Physical activity is good for your heart. It helps control your blood pressure, cholesterol levels, and weight. Try to be physically active every day. Ask your health care provider what activities are best for you. °Be a healthy weight °Extra weight can strain your heart and affect your blood pressure and cholesterol levels. Lose weight with diet and exercise if recommended by your health care provider. °Eat heart-healthy foods °Follow a healthy eating plan as recommended by your health care provider or dietitian. Heart-healthy foods include: °· High-fiber foods. These include oat bran, oatmeal, and whole-grain breads and cereals. °· Fruits and vegetables. °Avoid: °· Alcohol. °· Fried foods. °· Foods high in saturated fat. These include meats, butter, whole dairy products, shortening, and coconut or palm oil. °· Salty foods. These include canned food, luncheon meat, salty snacks, and fast food. °Keep your cholesterol levels under control °Cholesterol is a substance that is used for many important functions. When your cholesterol levels are high, cholesterol can stick to the insides of your blood vessels, making them narrow or clog. This can lead to chest pain (angina) and a heart attack. °Keep your cholesterol levels under control as recommended by your health care provider. Have your cholesterol checked at least once a year. Target cholesterol levels (in mg/dL) for most people are: °· Total cholesterol below 200. °· LDL cholesterol below 100. °· HDL cholesterol above 40 in men and above 50 in women. °· Triglycerides below 150. °Keep your blood pressure under control °Having high blood pressure (hypertension) puts you at risk for stroke and other forms of heart disease. Keep your blood pressure under control as recommended by your health care provider. Ask your  health care provider if you need treatment to lower your blood pressure. If you are 18-39 years of age, have your blood pressure checked every 3-5 years. If you are 40 years of age or older, have your blood pressure checked every year. °Do not use tobacco products °Tobacco smoke can damage your heart and blood vessels. Do not use any tobacco products including cigarettes, chewing tobacco, or electronic cigarettes. If you need help quitting, ask your health care provider. °Take medicines as directed °Take medicines only as directed by your health care provider. Ask your health care provider whether you should take an aspirin every day. Taking aspirin can help reduce your risk of heart disease and stroke. °Where to find more information: °To find out more about heart disease, visit the American Heart Association's website at www.americanheart.org °This information is not intended to replace advice given to you by your health care provider. Make sure you discuss any questions you have with your health care provider. °Document Released: 08/16/2003 Document Revised: 06/01/2015 Document Reviewed: 02/25/2013 °Elsevier Interactive Patient Education © 2017 Elsevier Inc. ° °

## 2015-12-27 NOTE — H&P (View-Only) (Signed)
 Cardiology Consultation Note  Patient ID: Tyler Deleon, MRN: [id number], DOB/AGE: 47/07/1970 47 y.o. Admit date: 12/26/2015   Date of Consult: 12/27/2015 Primary Physician: Susan Fisher, PA-C Primary Cardiologist: New to [facility] Requesting Physician: Dr. Patel, MD  Chief Complaint: Chest pain Reason for Consult: Same  HPI: 45 y.o. male with h/o HLD, mild COPD based on PFTs in 2015, prior tobacco abuse x "several years" at 0.5 ppd quitting 10 years ago, OSA on CPAP, insomnia, PTSD, and anxiety who presented to the ED on 12/12 with chest pain x 1 week.   Patient previously followed by Dr. Kowalski, MD, lasting seeing him in 2015. At that time he was being evaluated for exertional SOB. Workup included a stress test that showed reasonable exercise tolerance without evidence of myocardial ischemia or evidence of decreased exercise tolerance. He did have SOB at peak exercise without evidence of chest discomfort. There were no rhythm disturbances. He was previously in the [place] war and currently works in the [employer] office.   Patient reported chest pain x 1 week that he thought was a pulled muscle. Pain episodes have been progressively getting longer in duration. Pain is exertional. Over the prior couple of days his pain will now come on with minimal exertion such as ambulating to his car in a parking lot. Pain is described as a tightness, located along left-lateral chest wall and radiates substernally and to the neck and left shoulder. It is associated with SOB, nausea, and diaphoresis. Upon his arrival he was found to have negative troponin x 4, SCr 1.03, K+ 3.5, unremarkable CBC, negative CXR, EKG showed NSR, 76 bpm, no acute st/t changes. He was admitted, given ASA, and cardiology was consulted. Currently without chest pain, his last episode was 3 days, though he has persistently had SOB and fatigue.      Past Medical History:  Diagnosis Date  . Anxiety       Most Recent Cardiac  Studies: As above   Surgical History:  Past Surgical History:  Procedure Laterality Date  . EYE SURGERY Bilateral    cataract removals     Home Meds: Prior to Admission medications   Medication Sig Start Date End Date Taking? Authorizing Provider  Fluticasone-Salmeterol (ADVAIR DISKUS) 100-50 MCG/DOSE AEPB Inhale 1 puff into the lungs 2 (two) times daily. 10/19/15  Yes Susan W Fisher, PA-C  lamoTRIgine (LAMICTAL) 100 MG tablet Take 100 mg by mouth 2 (two) times daily.   Yes Historical Provider, MD  QUEtiapine (SEROQUEL) 200 MG tablet TAKE ONE (1) TABLET BY MOUTH EVERY DAY 05/12/15  Yes Susan W Fisher, PA-C  venlafaxine XR (EFFEXOR-XR) 75 MG 24 hr capsule TAKE THREE CAPSULES BY MOUTH DAILY AS DIRECTED 09/09/15  Yes Susan W Fisher, PA-C    Inpatient Medications:  . enoxaparin (LOVENOX) injection  40 mg Subcutaneous Q24H  . Influenza vac split quadrivalent PF  0.5 mL Intramuscular Tomorrow-1000  . lamoTRIgine  100 mg Oral BID  . mometasone-formoterol  2 puff Inhalation BID  . QUEtiapine  200 mg Oral QHS  . venlafaxine XR  75 mg Oral Q breakfast     Allergies:  Allergies  Allergen Reactions  . Bupropion Other (See Comments)    Increased anger     Social History   Social History  . Marital status: Married    Spouse name: N/A  . Number of children: N/A  . Years of education: N/A   Occupational History  . Not on file.   Social History   Main Topics  . Smoking status: Former Smoker    Types: Cigarettes  . Smokeless tobacco: Current User    Types: Snuff     Comment: was an occasional smoker, never a pack a day smoker  . Alcohol use No  . Drug use: No  . Sexual activity: Not on file   Other Topics Concern  . Not on file   Social History Narrative  . No narrative on file     Family History  Problem Relation Age of Onset  . Diabetes Mother   . Hepatitis C Mother   . Multiple sclerosis Mother      Review of Systems: Review of Systems  Constitutional: Positive  for diaphoresis and malaise/fatigue. Negative for chills, fever and weight loss.  HENT: Negative for congestion.   Eyes: Negative for discharge and redness.  Respiratory: Positive for shortness of breath. Negative for cough, hemoptysis, sputum production and wheezing.   Cardiovascular: Positive for chest pain. Negative for palpitations, orthopnea, claudication, leg swelling and PND.  Gastrointestinal: Positive for nausea. Negative for abdominal pain, blood in stool, heartburn, melena and vomiting.  Genitourinary: Negative for hematuria.  Musculoskeletal: Negative for falls and myalgias.  Skin: Negative for rash.  Neurological: Positive for weakness. Negative for dizziness, tingling, tremors, sensory change, speech change, focal weakness and loss of consciousness.  Endo/Heme/Allergies: Does not bruise/bleed easily.  Psychiatric/Behavioral: Negative for substance abuse. The patient is nervous/anxious.   All other systems reviewed and are negative.   Labs:  Recent Labs  12/26/15 1451 12/26/15 1729 12/26/15 2334 12/27/15 0529  TROPONINI <0.03 <0.03 <0.03 <0.03   Lab Results  Component Value Date   WBC 7.5 12/26/2015   HGB 14.7 12/26/2015   HCT 43.1 12/26/2015   MCV 88.0 12/26/2015   PLT 257 12/26/2015     Recent Labs Lab 12/26/15 1012  NA 138  K 3.5  CL 105  CO2 26  BUN 12  CREATININE 1.03  CALCIUM 8.7*  GLUCOSE 141*   No results found for: CHOL, HDL, LDLCALC, TRIG No results found for: DDIMER  Radiology/Studies:  Dg Chest 2 View  Result Date: 12/26/2015 CLINICAL DATA:  Chest pain. EXAM: CHEST  2 VIEW COMPARISON:  04/12/2015. FINDINGS: Mediastinum and hilar structures are normal. No focal infiltrate. Cardiomegaly with normal pulmonary vascularity. No pleural effusion or pneumothorax. IMPRESSION: No acute cardiopulmonary disease. Electronically Signed   By: Thomas  Register   On: 12/26/2015 10:48    EKG: Interpreted by me showed:  NSR, 76 bpm, no acute st/t  changes Telemetry: Interpreted by me showed: NSR, 70's bpm  Weights: Filed Weights   12/26/15 1007 12/26/15 1807  Weight: 233 lb (105.7 kg) 235 lb 12.8 oz (107 kg)     Physical Exam: Blood pressure 111/78, pulse 65, temperature 97.8 F (36.6 C), temperature source Oral, resp. rate 18, height 5' 11" (1.803 m), weight 235 lb 12.8 oz (107 kg), SpO2 97 %. Body mass index is 32.89 kg/m. General: Well developed, well nourished, in no acute distress. Head: Normocephalic, atraumatic, sclera non-icteric, no xanthomas, nares are without discharge.  Neck: Negative for carotid bruits. JVD not elevated. Lungs: Clear bilaterally to auscultation without wheezes, rales, or rhonchi. Breathing is unlabored. Heart: RRR with S1 S2. No murmurs, rubs, or gallops appreciated. Abdomen: Soft, non-tender, non-distended with normoactive bowel sounds. No hepatomegaly. No rebound/guarding. No obvious abdominal masses. Msk:  Strength and tone appear normal for age. Extremities: No clubbing or cyanosis. No edema. Distal pedal pulses are 2+ and equal bilaterally.   Neuro: Alert and oriented X 3. No facial asymmetry. No focal deficit. Moves all extremities spontaneously. Psych:  Responds to questions appropriately with a normal affect.    Assessment and Plan:  Principal Problem:   Unstable angina (HCC)    1. Unstable angina: -Currently chest pain free -Ruled out -Given his worsening exertional symptoms he wants cardiac cath and not stress test -Schedule for cardiac cath with Dr. End today -Risks and benefits of cardiac catheterization have been discussed with the patient including risks of bleeding, bruising, infection, kidney damage, stroke, heart attack, and death. The patient understands these risks and is willing to proceed with the procedure. All questions have been answered and concerns listened to -ASA -Add Lipid and A1C for further risk stratification  2. PTSD/anxiety: -Stable -Per  IM   Signed, Reeve Mallo, PA-C [facility] Pager: [phone] 12/27/2015, 8:06 AM  

## 2015-12-28 ENCOUNTER — Encounter: Payer: Self-pay | Admitting: Internal Medicine

## 2015-12-28 LAB — HEMOGLOBIN A1C
Hgb A1c MFr Bld: 5.8 % — ABNORMAL HIGH (ref 4.8–5.6)
MEAN PLASMA GLUCOSE: 120 mg/dL

## 2015-12-29 ENCOUNTER — Encounter: Payer: Self-pay | Admitting: Physician Assistant

## 2015-12-29 ENCOUNTER — Ambulatory Visit: Payer: Self-pay | Admitting: Physician Assistant

## 2015-12-29 VITALS — BP 125/85 | HR 72 | Temp 97.1°F

## 2015-12-29 DIAGNOSIS — I208 Other forms of angina pectoris: Secondary | ICD-10-CM

## 2015-12-29 NOTE — Progress Notes (Signed)
S: here for recheck from hospital admission, was admitted for cp for observation to r/o MI, pt had cardiac cath done which was negative for CAD, pt was given lasix to improve cardiac output, imdur for angina sx, pt states the imdur is giving him bad headaches, ?if could go on a different medication  O: vitals wnl, nad, lungs c t a, cv rrr, neuro intact  A: stable angina, hospital admission follow up  P: stop imdur, discussed with DR Sullivan LoneGilbert and he is ok with stopping the imdur since the cardiac cath was clean

## 2015-12-29 NOTE — Discharge Summary (Signed)
[facility] [place] [facility]   PATIENT NAME: Tyler ArbourJeff Eastep    MR#:  [id number]  DATE OF BIRTH:  December 19, 1970  DATE OF ADMISSION:  12/26/2015   ADMITTING PHYSICIAN: Enedina FinnerSona Patel, MD  DATE OF DISCHARGE: 12/27/2015  5:50 PM  PRIMARY CARE PHYSICIAN: Greig RightSusan Fisher, PA-C   ADMISSION DIAGNOSIS:  Unstable angina (HCC) [I20.0] Chest pain with moderate risk for cardiac etiology [R07.9] DISCHARGE DIAGNOSIS:  Principal Problem:   Unstable angina (HCC)  SECONDARY DIAGNOSIS:   Past Medical History:  Diagnosis Date  . Anxiety    HOSPITAL COURSE:  Patient 45 year old male with history of hyperlipidemia, mild COPD, and remote tobacco use, admitted with exertional chest pain and shortness of breath over the last week. He was previously seen by Dr. Gwen PoundsKowalski, most recently in 2015. Stress test at that time was unremarkable. He notes his chest symptoms over the last 1-2 weeks are distinctly different. He is currently asymptomatic.  1. Unstable angina: -Currently chest pain free -Ruled out with serial troponins -Given his worsening exertional symptoms he wants cardiac cath and not stress test -S/p cardiac cath with Dr. Okey DupreEnd -  No angiographically significant coronary artery disease. Normal left ventricular contraction. Mildly elevated left ventricular filling pressure. - Cardio recommended to start isosorbide mononitrate for possible microvascular dysfunction and lasix. Transthoracic echocardiogram wnl  2. PTSD/anxiety: -Stable -may be likely contributor to his symptoms  DISCHARGE CONDITIONS:  STABLE CONSULTS OBTAINED:  Treatment Team:  Iran OuchMuhammad A Arida, MD DRUG ALLERGIES:   Allergies  Allergen Reactions  . Bupropion Other (See Comments)    Increased anger    DISCHARGE MEDICATIONS:   Allergies as of 12/27/2015      Reactions   Bupropion Other (See Comments)   Increased anger       Medication List    TAKE these medications   aspirin 81 MG chewable  tablet Chew 1 tablet (81 mg total) by mouth daily.   Fluticasone-Salmeterol 100-50 MCG/DOSE Aepb Commonly known as:  ADVAIR DISKUS Inhale 1 puff into the lungs 2 (two) times daily.   furosemide 20 MG tablet Commonly known as:  LASIX Take 1 tablet (20 mg total) by mouth daily.   isosorbide mononitrate 30 MG 24 hr tablet Commonly known as:  IMDUR Take 1 tablet (30 mg total) by mouth daily.   lamoTRIgine 100 MG tablet Commonly known as:  LAMICTAL Take 100 mg by mouth 2 (two) times daily.   QUEtiapine 200 MG tablet Commonly known as:  SEROQUEL TAKE ONE (1) TABLET BY MOUTH EVERY DAY   venlafaxine XR 75 MG 24 hr capsule Commonly known as:  EFFEXOR-XR TAKE THREE CAPSULES BY MOUTH DAILY AS DIRECTED        DISCHARGE INSTRUCTIONS:   DIET:  Cardiac diet DISCHARGE CONDITION:  Good ACTIVITY:  Activity as tolerated OXYGEN:  Home Oxygen: No.  Oxygen Delivery: room air DISCHARGE LOCATION:  home   If you experience worsening of your admission symptoms, develop shortness of breath, life threatening emergency, suicidal or homicidal thoughts you must seek medical attention immediately by calling 911 or calling your MD immediately  if symptoms less severe.  You Must read complete instructions/literature along with all the possible adverse reactions/side effects for all the Medicines you take and that have been prescribed to you. Take any new Medicines after you have completely understood and accpet all the possible adverse reactions/side effects.   Please note  You were cared for by a hospitalist during your hospital stay. If you have  any questions about your discharge medications or the care you received while you were in the hospital after you are discharged, you can call the unit and asked to speak with the hospitalist on call if the hospitalist that took care of you is not available. Once you are discharged, your primary care physician will handle any further medical issues. Please  note that NO REFILLS for any discharge medications will be authorized once you are discharged, as it is imperative that you return to your primary care physician (or establish a relationship with a primary care physician if you do not have one) for your aftercare needs so that they can reassess your need for medications and monitor your lab values.    On the day of Discharge:  VITAL SIGNS:  Blood pressure 137/84, pulse 64, temperature 97.6 F (36.4 C), temperature source Oral, resp. rate 16, height 5\' 11"  (1.803 m), weight 107 kg (235 lb 12.8 oz), SpO2 91 %. PHYSICAL EXAMINATION:  GENERAL:  45 y.o.-year-old patient lying in the bed with no acute distress.  EYES: Pupils equal, round, reactive to light and accommodation. No scleral icterus. Extraocular muscles intact.  HEENT: Head atraumatic, normocephalic. Oropharynx and nasopharynx clear.  NECK:  Supple, no jugular venous distention. No thyroid enlargement, no tenderness.  LUNGS: Normal breath sounds bilaterally, no wheezing, rales,rhonchi or crepitation. No use of accessory muscles of respiration.  CARDIOVASCULAR: S1, S2 normal. No murmurs, rubs, or gallops.  ABDOMEN: Soft, non-tender, non-distended. Bowel sounds present. No organomegaly or mass.  EXTREMITIES: No pedal edema, cyanosis, or clubbing.  NEUROLOGIC: Cranial nerves II through XII are intact. Muscle strength 5/5 in all extremities. Sensation intact. Gait not checked.  PSYCHIATRIC: The patient is alert and oriented x 3.  SKIN: No obvious rash, lesion, or ulcer.  DATA REVIEW:   CBC  Recent Labs Lab 12/26/15 1012  WBC 7.5  HGB 14.7  HCT 43.1  PLT 257    Chemistries   Recent Labs Lab 12/26/15 1012  NA 138  K 3.5  CL 105  CO2 26  GLUCOSE 141*  BUN 12  CREATININE 1.03  CALCIUM 8.7*    Follow-up Information    Greig RightSusan Fisher, PA-C. Schedule an appointment as soon as possible for a visit in 1 week(s).   Specialty:  Physician Assistant Why:  You will need to call  for an appointment, ccs Contact information: [address] [place] [address] [phone]        Ned ClinesFLEMING,HERBON, MD. Schedule an appointment as soon as possible for a visit in 2 week(s).   Specialty:  Specialist Why:  Tuesday, December 19th at 915am, ccs Contact information: [address] [facility] [place] [address] [phone]        Yvonne Kendallhristopher End, MD Follow up in 3 week(s).   Specialty:  Cardiology Why:  Friday, January 5th at 215pm, ccs Contact information: [address] [place] [address] [phone]            Management plans discussed with the patient, family and they are in agreement.  CODE STATUS: FULL CODE  TOTAL TIME TAKING CARE OF THIS PATIENT: 45 minutes.    Delfino LovettVipul Deneise Getty M.D on 12/29/2015 at 8:04 PM  Between 7am to 6pm - Pager - [phone]  After 6pm go to [email or web address] - password [email or web address] [facility]  [facility]  Office  [phone]  CC: Primary care physician; Greig RightSusan Fisher, PA-C   Note: This dictation was prepared with Reubin Milanragon  dictation along with smaller phrase technology. Any transcriptional errors that result from this process are unintentional.

## 2016-01-20 ENCOUNTER — Encounter: Payer: Self-pay | Admitting: Internal Medicine

## 2016-01-20 ENCOUNTER — Ambulatory Visit (INDEPENDENT_AMBULATORY_CARE_PROVIDER_SITE_OTHER): Payer: Managed Care, Other (non HMO) | Admitting: Internal Medicine

## 2016-01-20 VITALS — BP 110/76 | HR 74 | Ht 71.0 in | Wt 235.5 lb

## 2016-01-20 DIAGNOSIS — R0789 Other chest pain: Secondary | ICD-10-CM

## 2016-01-20 DIAGNOSIS — I519 Heart disease, unspecified: Secondary | ICD-10-CM | POA: Diagnosis not present

## 2016-01-20 DIAGNOSIS — I5189 Other ill-defined heart diseases: Secondary | ICD-10-CM

## 2016-01-20 MED ORDER — FUROSEMIDE 20 MG PO TABS
20.0000 mg | ORAL_TABLET | Freq: Every day | ORAL | 3 refills | Status: DC | PRN
Start: 2016-01-20 — End: 2017-05-29

## 2016-01-20 MED ORDER — DILTIAZEM HCL ER COATED BEADS 120 MG PO CP24
120.0000 mg | ORAL_CAPSULE | Freq: Every day | ORAL | 3 refills | Status: DC
Start: 2016-01-20 — End: 2017-05-29

## 2016-01-20 NOTE — Progress Notes (Signed)
Follow-up Outpatient Visit Date: 01/20/2016  Chief Complaint: Follow-up chest pain  HPI:  Tyler Deleon is a 46 y.o. year-old male with history of hyperlipidemia, mild obstructive lung disease, and remote tobacco use, who presents for follow-up of chest pain with recent hospitalization for suspected unstable angina. The patient underwent coronary angiography that showed no significant CAD with a sluggish flow was noted in the distal LAD that could reflect microvascular dysfunction. LVEDP was mildly elevated. Echocardiogram also showed diastolic dysfunction. Patient was discharged on isosorbide mononitrate and furosemide. He was intolerant of isosorbide mononitrate due to headaches. He has continued to have occasional sharp chest pain along the left mid axillary line. This lasts a few seconds at a time and often occurs when he rotates or reaches with his arms quickly. The pain is not related to exertion or deep inspiration. He denies shortness of breath at rest but has noted some exertional dyspnea and fatigue. He was evaluated earlier today in the pulmonary clinic. 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 He was started on Singulair and advised to use albuterol prior to exercise. Advair was discontinued.  The patient denies orthopnea, PND, and edema. He remains on low-dose furosemide. He is sleeping with CPAP on a consistent basis. 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 He is trying to lose weight and exercise more.  --------------------------------------------------------------------------------------------------  Cardiovascular History & Procedures: Cardiovascular Problems:  Noncardiac chest pain  Risk Factors:  Hyperlipidemia and  Cath/PCI:  LHC (12/27/15): No angiographically significant CAD, the sluggish flow in the distal  LAD was noted and may reflect microvascular disease. LVEDP mildly elevated at 20 mmHg. Normal LV contraction (EF 55-65%).  CV Surgery:  None  EP Procedures and Devices:  None  Non-Invasive Evaluation(s):  TTE (12/27/15): LV cavity at upper limits of normal in size with mild LVH. Normal LV contraction (EF 60-65%). Grade 2 diastolic dysfunction with mild left atrial enlargement. Normal RV size and function. No significant valvular abnormalities.  Recent CV Pertinent Labs: Lab Results  Component Value Date   CHOL 171 12/27/2015   HDL 37 (L) 12/27/2015   LDLCALC 108 (H) 12/27/2015   TRIG 128 12/27/2015   CHOLHDL 4.6 12/27/2015   K 3.5 12/26/2015   BUN 12 12/26/2015   CREATININE 1.03 12/26/2015    Past medical and surgical history were reviewed and updated in EPIC.   Outpatient Encounter Prescriptions as of 01/20/2016  Medication Sig  . aspirin 81 MG chewable tablet Chew 1 tablet (81 mg total) by mouth daily.  . furosemide (LASIX) 20 MG tablet Take 1 tablet (20 mg total) by mouth daily.  . isosorbide mononitrate (IMDUR) 30 MG 24 hr tablet Take 1 tablet (30 mg total) by mouth daily.  Marland Kitchen. lamoTRIgine (LAMICTAL) 100 MG tablet Take 100 mg by mouth 2 (two) times daily.  . QUEtiapine (SEROQUEL) 200 MG tablet TAKE ONE (1) TABLET BY MOUTH EVERY DAY  . venlafaxine XR (EFFEXOR-XR) 75 MG 24 hr capsule TAKE THREE CAPSULES BY MOUTH DAILY AS DIRECTED  . montelukast (SINGULAIR) 10 MG tablet Take 10 mg by mouth at bedtime.  . [DISCONTINUED] Fluticasone-Salmeterol (ADVAIR DISKUS) 100-50 MCG/DOSE AEPB Inhale 1 puff into the lungs 2 (two) times daily. (Patient not taking: Reported on 01/20/2016)   No facility-administered encounter medications on file as of 01/20/2016.     Allergies: Bupropion  Social History  Social History  . Marital status: Married    Spouse name: N/A  . Number of children: N/A  . Years of education: N/A   Occupational History  . Not on file.   Social History Main Topics   . Smoking status: Former Smoker    Types: Cigarettes  . Smokeless tobacco: Current User    Types: Snuff     Comment: was an occasional smoker, never a pack a day smoker  . Alcohol use No  . Drug use: No  . Sexual activity: Not on file   Other Topics Concern  . Not on file   Social History Narrative  . No narrative on file    Family History  Problem Relation Age of Onset  . Diabetes Mother   . Hepatitis C Mother   . Multiple sclerosis Mother     Review of Systems: Patient noticed small blister along the medial aspect of right wrist after catheterization. This has since healed. Otherwise, a 12-system review of systems was performed and was negative except as noted in the HPI.  --------------------------------------------------------------------------------------------------  Physical Exam: BP 110/76 (BP Location: Left Arm, Patient Position: Sitting, Cuff Size: Large)   Pulse 74   Ht 5\' 11"  (1.803 m)   Wt 106.8 kg (235 lb 8 oz)   BMI 32.85 kg/m   General:  Obese man, seated comfortably in the exam room. HEENT: No conjunctival pallor or scleral icterus.  Moist mucous membranes.  OP clear. Neck: Supple without lymphadenopathy, thyromegaly, JVD, or HJR.  No carotid bruit. Lungs: Normal work of breathing.  Clear to auscultation bilaterally without wheezes or crackles. Heart: Regular rate and rhythm without murmurs, rubs, or gallops.  Non-displaced PMI. Abd: Bowel sounds present.  Soft, NT/ND without hepatosplenomegaly Ext: No lower extremity edema.  Radial, PT, and DP pulses are 2+ bilaterally. Right radial arteriotomy site is well-healed. Small amount of erythema is noted along the medial aspect of the right wrist without open wound or drainage. Skin: warm and dry without rash  EKG:  Normal sinus rhythm without significant abnormalities. No change from prior tracing on 12/26/15 (I have personally reviewed broke tracings).  Lab Results  Component Value Date   WBC 7.5  12/26/2015   HGB 14.7 12/26/2015   HCT 43.1 12/26/2015   MCV 88.0 12/26/2015   PLT 257 12/26/2015    Lab Results  Component Value Date   NA 138 12/26/2015   K 3.5 12/26/2015   CL 105 12/26/2015   CO2 26 12/26/2015   BUN 12 12/26/2015   CREATININE 1.03 12/26/2015   GLUCOSE 141 (H) 12/26/2015    Lab Results  Component Value Date   CHOL 171 12/27/2015   HDL 37 (L) 12/27/2015   LDLCALC 108 (H) 12/27/2015   TRIG 128 12/27/2015   CHOLHDL 4.6 12/27/2015    --------------------------------------------------------------------------------------------------  ASSESSMENT AND PLAN: Chest pain Quality of pain is most consistent with a muscular skeletal etiology. Lack of obstructive CAD by recent catheterization supports this. The patient can continue with symptomatic management, including over-the-counter analgesics.  Diastolic dysfunction Echocardiogram during recent hospitalization showed grade 2 diastolic dysfunction. Mr. Whetsel was also noted to have mildly elevated LVEDP at the time of catheterization. He appears euvolemic and well compensated on exam today. I have advised him to take furosemide 20 mg daily as needed for weight gain and edema. We will start diltiazem 120 mg daily to see if this helps improve some of his exertional dyspnea in the setting of diastolic dysfunction. He should  also continue on the medication regimen prescribed by his pulmonologist.  Follow-up: Return to clinic in 3 months.  Yvonne Kendall, MD 01/20/2016 2:34 PM

## 2016-01-20 NOTE — Patient Instructions (Signed)
Medication Instructions:  Your physician has recommended you make the following change in your medication:  1- START Diltiazem ER 120 mg (1 tablet) by mouth once a day. 2- TAKE Furosemide 20 mg by mouth daily AS NEEDED.   Labwork: - None ordered.   Testing/Procedures: - None ordered.   Follow-Up: Your physician wants you to follow-up in: 3 MONTHS WITH DR END.  You will receive a reminder letter in the mail two months in advance. If you don't receive a letter, please call our office to schedule the follow-up appointment.   If you need a refill on your cardiac medications before your next appointment, please call your pharmacy.

## 2016-01-21 ENCOUNTER — Encounter: Payer: Self-pay | Admitting: Internal Medicine

## 2016-01-21 DIAGNOSIS — R0789 Other chest pain: Secondary | ICD-10-CM | POA: Insufficient documentation

## 2016-01-21 DIAGNOSIS — I5189 Other ill-defined heart diseases: Secondary | ICD-10-CM | POA: Insufficient documentation

## 2016-02-21 ENCOUNTER — Other Ambulatory Visit: Payer: Self-pay | Admitting: Emergency Medicine

## 2016-02-21 MED ORDER — LAMOTRIGINE 100 MG PO TABS: 100.0000 mg | ORAL_TABLET | Freq: Two times a day (BID) | ORAL | 3 refills | Status: DC

## 2016-02-21 MED ORDER — MONTELUKAST SODIUM 10 MG PO TABS: 10.0000 mg | ORAL_TABLET | Freq: Every day | ORAL | 3 refills | Status: DC

## 2016-02-21 NOTE — Telephone Encounter (Signed)
Med refill for lamictal and singulair, 90 day supplies with 3 refills

## 2016-04-03 ENCOUNTER — Other Ambulatory Visit: Payer: Self-pay | Admitting: Physician Assistant

## 2016-04-03 NOTE — Telephone Encounter (Signed)
Med refill for effexor-xr approved, pt has hx of ptsd from [employer], has been on medication for years without difficulty

## 2016-05-09 ENCOUNTER — Other Ambulatory Visit: Payer: Self-pay | Admitting: Physician Assistant

## 2016-05-09 NOTE — Telephone Encounter (Signed)
Med refill for seroquel approved, pt has been on medication for long period of time for ptsd

## 2016-05-14 ENCOUNTER — Other Ambulatory Visit: Payer: Self-pay

## 2016-05-14 ENCOUNTER — Encounter (INDEPENDENT_AMBULATORY_CARE_PROVIDER_SITE_OTHER): Payer: Self-pay

## 2016-05-14 VITALS — BP 120/70 | HR 71 | Temp 98.2°F | Ht 71.0 in | Wt 230.0 lb

## 2016-05-14 DIAGNOSIS — Z299 Encounter for prophylactic measures, unspecified: Secondary | ICD-10-CM

## 2016-05-14 NOTE — Progress Notes (Signed)
Patient came in to have his biometric screening and labs done today.  Patient is scheduled to return tomorrow for his annual physical.

## 2016-05-15 ENCOUNTER — Ambulatory Visit: Payer: Self-pay | Admitting: Physician Assistant

## 2016-05-15 ENCOUNTER — Encounter: Payer: Self-pay | Admitting: Physician Assistant

## 2016-05-15 VITALS — BP 120/70 | HR 71 | Temp 98.2°F | Ht 71.0 in | Wt 230.0 lb

## 2016-05-15 DIAGNOSIS — Z0000 Encounter for general adult medical examination without abnormal findings: Secondary | ICD-10-CM

## 2016-05-15 MED ORDER — DIVALPROEX SODIUM ER 500 MG PO TB24: 1000.0000 mg | ORAL_TABLET | Freq: Every day | ORAL | 3 refills | Status: DC

## 2016-05-15 MED ORDER — VITAMIN D (ERGOCALCIFEROL) 1.25 MG (50000 UNIT) PO CAPS: 50000.0000 [IU] | ORAL_CAPSULE | ORAL | 3 refills | Status: DC
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
## 2016-05-16 NOTE — Progress Notes (Signed)
Called pt to see how he is feeling.  States he feels ok.  Is at home watching tv.  No si/hi.  Started the new medication.  Will return in 1 week for depakote level as discussed with DR Maryruth Bun.  If therapeutic level then can stop the lamictal.  Gave pt my cell phone number and he is to call if any problems or suicidal/homicidal thoughts

## 2016-05-17 LAB — SPECIMEN STATUS REPORT

## 2016-05-17 LAB — TESTOSTERONE: TESTOSTERONE: 362 ng/dL (ref 264–916)

## 2016-05-18 NOTE — Progress Notes (Signed)
Pt called, states his new medication is making him feel like a zombie, ?if he should cut in 1/2 to adjust, did decrease the lamictal.  States he is subdued and not angry, denies si/hi.  Will return to work next week and come in Wed to check depakote level. Agreed that he could take 500mg  for a few days and then we will adjust up to 750 or 1000

## 2016-05-19 LAB — CMP12+LP+TP+TSH+6AC+PSA+CBC…
A/G RATIO: 1.9 (ref 1.2–2.2)
ALT: 30 IU/L (ref 0–44)
AST: 20 IU/L (ref 0–40)
Albumin: 5 g/dL (ref 3.5–5.5)
Alkaline Phosphatase: 99 IU/L (ref 39–117)
BASOS ABS: 0.1 10*3/uL (ref 0.0–0.2)
BUN / CREAT RATIO: 14 (ref 9–20)
BUN: 17 mg/dL (ref 6–24)
Basos: 1 %
Bilirubin Total: 0.3 mg/dL (ref 0.0–1.2)
CALCIUM: 10 mg/dL (ref 8.7–10.2)
CHOL/HDL RATIO: 4.8 ratio (ref 0.0–5.0)
CREATININE: 1.18 mg/dL (ref 0.76–1.27)
Chloride: 101 mmol/L (ref 96–106)
Cholesterol, Total: 208 mg/dL — ABNORMAL HIGH (ref 100–199)
EOS (ABSOLUTE): 0.2 10*3/uL (ref 0.0–0.4)
Eos: 3 %
Estimated CHD Risk: 1 times avg. (ref 0.0–1.0)
Free Thyroxine Index: 1.4 (ref 1.2–4.9)
GFR calc non Af Amer: 73 mL/min/{1.73_m2} (ref >59)
GFR, EST AFRICAN AMERICAN: 84 mL/min/{1.73_m2} (ref >59)
GGT: 43 IU/L (ref 0–65)
GLOBULIN, TOTAL: 2.7 g/dL (ref 1.5–4.5)
GLUCOSE: 109 mg/dL — AB (ref 65–99)
HDL: 43 mg/dL (ref >39)
HEMATOCRIT: 43.1 % (ref 37.5–51.0)
Hemoglobin: 14.6 g/dL (ref 13.0–17.7)
IMMATURE GRANULOCYTES: 0 %
IRON: 112 ug/dL (ref 38–169)
Immature Grans (Abs): 0 10*3/uL (ref 0.0–0.1)
LDH: 189 IU/L (ref 121–224)
LDL Calculated: 126 mg/dL — ABNORMAL HIGH (ref 0–99)
Lymphocytes Absolute: 2.3 10*3/uL (ref 0.7–3.1)
Lymphs: 34 %
MCH: 29.7 pg (ref 26.6–33.0)
MCHC: 33.9 g/dL (ref 31.5–35.7)
MCV: 88 fL (ref 79–97)
Monocytes Absolute: 0.6 10*3/uL (ref 0.1–0.9)
Monocytes: 9 %
NEUTROS ABS: 3.5 10*3/uL (ref 1.4–7.0)
Neutrophils: 53 %
PHOSPHORUS: 3.4 mg/dL (ref 2.5–4.5)
Platelets: 264 10*3/uL (ref 150–379)
Potassium: 4.3 mmol/L (ref 3.5–5.2)
Prostate Specific Ag, Serum: 1.5 ng/mL (ref 0.0–4.0)
RBC: 4.92 x10E6/uL (ref 4.14–5.80)
RDW: 13.7 % (ref 12.3–15.4)
SODIUM: 146 mmol/L — AB (ref 134–144)
T3 UPTAKE RATIO: 22 % — AB (ref 24–39)
T4 TOTAL: 6.2 ug/dL (ref 4.5–12.0)
TOTAL PROTEIN: 7.7 g/dL (ref 6.0–8.5)
TSH: 2.1 u[IU]/mL (ref 0.450–4.500)
Triglycerides: 194 mg/dL — ABNORMAL HIGH (ref 0–149)
URIC ACID: 6.1 mg/dL (ref 3.7–8.6)
VLDL Cholesterol Cal: 39 mg/dL (ref 5–40)
WBC: 6.7 10*3/uL (ref 3.4–10.8)

## 2016-05-19 LAB — HEPATITIS C ANTIBODY (REFLEX)

## 2016-05-19 LAB — VITAMIN D 25 HYDROXY (VIT D DEFICIENCY, FRACTURES): Vit D, 25-Hydroxy: 19.8 ng/mL — ABNORMAL LOW (ref 30.0–100.0)

## 2016-05-19 LAB — HCV COMMENT:

## 2016-05-19 LAB — HIV ANTIBODY (ROUTINE TESTING W REFLEX): HIV Screen 4th Generation wRfx: NONREACTIVE

## 2016-06-18 ENCOUNTER — Telehealth: Payer: Self-pay | Admitting: Internal Medicine

## 2016-06-18 NOTE — Telephone Encounter (Signed)
3 attempts to schedule fu appt from recall list.   Deleting recall.  3 month fu per ckout 01/20/16  Dr. Okey DupreEnd

## 2016-06-19 ENCOUNTER — Emergency Department: Payer: Worker's Compensation

## 2016-06-19 ENCOUNTER — Encounter: Payer: Self-pay | Admitting: Emergency Medicine

## 2016-06-19 ENCOUNTER — Emergency Department
Admission: EM | Admit: 2016-06-19 | Discharge: 2016-06-19 | Disposition: A | Discharge status: Home / Self Care | Payer: Worker's Compensation | Attending: Emergency Medicine | Admitting: Emergency Medicine

## 2016-06-19 DIAGNOSIS — M25551 Pain in right hip: Secondary | ICD-10-CM | POA: Insufficient documentation

## 2016-06-19 DIAGNOSIS — J45909 Unspecified asthma, uncomplicated: Secondary | ICD-10-CM | POA: Diagnosis not present

## 2016-06-19 DIAGNOSIS — S79911A Unspecified injury of right hip, initial encounter: Secondary | ICD-10-CM | POA: Diagnosis present

## 2016-06-19 DIAGNOSIS — Y9389 Activity, other specified: Secondary | ICD-10-CM | POA: Diagnosis not present

## 2016-06-19 DIAGNOSIS — Z87891 Personal history of nicotine dependence: Secondary | ICD-10-CM | POA: Insufficient documentation

## 2016-06-19 DIAGNOSIS — Y92410 Unspecified street and highway as the place of occurrence of the external cause: Secondary | ICD-10-CM | POA: Diagnosis not present

## 2016-06-19 DIAGNOSIS — Y990 Civilian activity done for income or pay: Secondary | ICD-10-CM | POA: Diagnosis not present

## 2016-06-19 HISTORY — DX: Angina pectoris, unspecified: I20.9

## 2016-06-19 HISTORY — DX: Unspecified asthma, uncomplicated: J45.909

## 2016-06-19 MED ORDER — CYCLOBENZAPRINE HCL 5 MG PO TABS: 5.0000 mg | ORAL_TABLET | Freq: Three times a day (TID) | ORAL | 0 refills | Status: DC | PRN

## 2016-06-19 MED ORDER — ORPHENADRINE CITRATE 30 MG/ML IJ SOLN
60.0000 mg | INTRAMUSCULAR | Status: AC
  Administered 2016-06-19: 60 mg via INTRAVENOUS
  Filled 2016-06-19: qty 2

## 2016-06-19 MED ORDER — KETOROLAC TROMETHAMINE 10 MG PO TABS: 10.0000 mg | ORAL_TABLET | Freq: Three times a day (TID) | ORAL | 0 refills | Status: DC

## 2016-06-19 MED ORDER — KETOROLAC TROMETHAMINE 30 MG/ML IJ SOLN
30.0000 mg | Freq: Once | INTRAMUSCULAR | Status: AC
  Administered 2016-06-19: 30 mg via INTRAVENOUS
  Filled 2016-06-19: qty 1

## 2016-06-19 NOTE — ED Notes (Signed)
Family at bedside. Pt in NAD at this time. Talking with family.
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
## 2016-09-21 ENCOUNTER — Other Ambulatory Visit: Payer: Self-pay | Admitting: Physician Assistant

## 2016-09-21 NOTE — Telephone Encounter (Signed)
Med refill for depakote

## 2017-03-18 ENCOUNTER — Other Ambulatory Visit: Payer: Self-pay

## 2017-03-18 DIAGNOSIS — F4312 Post-traumatic stress disorder, chronic: Secondary | ICD-10-CM

## 2017-03-18 MED ORDER — DIVALPROEX SODIUM ER 500 MG PO TB24: 1000.0000 mg | ORAL_TABLET | Freq: Every day | ORAL | 0 refills | Status: DC

## 2017-05-29 ENCOUNTER — Ambulatory Visit: Payer: 59 | Admitting: Psychiatry

## 2017-05-29 ENCOUNTER — Encounter: Payer: Self-pay | Admitting: Psychiatry

## 2017-05-29 VITALS — BP 127/84 | HR 68 | Ht 71.0 in | Wt 243.4 lb

## 2017-05-29 DIAGNOSIS — F4310 Post-traumatic stress disorder, unspecified: Secondary | ICD-10-CM | POA: Diagnosis not present

## 2017-05-29 DIAGNOSIS — F330 Major depressive disorder, recurrent, mild: Secondary | ICD-10-CM | POA: Diagnosis not present

## 2017-05-29 DIAGNOSIS — Z9989 Dependence on other enabling machines and devices: Secondary | ICD-10-CM | POA: Diagnosis not present

## 2017-05-29 DIAGNOSIS — G4733 Obstructive sleep apnea (adult) (pediatric): Secondary | ICD-10-CM | POA: Diagnosis not present

## 2017-05-29 DIAGNOSIS — F17200 Nicotine dependence, unspecified, uncomplicated: Secondary | ICD-10-CM | POA: Diagnosis not present

## 2017-05-29 MED ORDER — DIVALPROEX SODIUM ER 500 MG PO TB24: 500.0000 mg | ORAL_TABLET | Freq: Every day | ORAL | 0 refills | Status: DC

## 2017-05-29 MED ORDER — VENLAFAXINE HCL ER 75 MG PO CP24: 225.0000 mg | ORAL_CAPSULE | Freq: Every day | ORAL | 0 refills | Status: DC

## 2017-05-29 MED ORDER — QUETIAPINE FUMARATE 100 MG PO TABS: 150.0000 mg | ORAL_TABLET | Freq: Every day | ORAL | 0 refills | Status: DC
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
## 2017-05-30 ENCOUNTER — Encounter: Payer: Self-pay | Admitting: Psychiatry

## 2017-07-03 ENCOUNTER — Ambulatory Visit: Payer: 59 | Admitting: Psychiatry

## 2017-07-03 ENCOUNTER — Encounter: Payer: Self-pay | Admitting: Psychiatry

## 2017-07-03 VITALS — BP 137/96 | HR 82 | Ht 71.0 in | Wt 248.0 lb

## 2017-07-03 DIAGNOSIS — F330 Major depressive disorder, recurrent, mild: Secondary | ICD-10-CM

## 2017-07-03 DIAGNOSIS — F4310 Post-traumatic stress disorder, unspecified: Secondary | ICD-10-CM | POA: Diagnosis not present

## 2017-07-03 DIAGNOSIS — F17200 Nicotine dependence, unspecified, uncomplicated: Secondary | ICD-10-CM

## 2017-07-03 NOTE — Progress Notes (Signed)
BH MD  OP Progress Note  07/03/2017 3:07 PM Tyler Deleon  MRN:  [id number]  Chief Complaint: ' I am here for follow up.' Chief Complaint    Follow-up     HPI: Tyler Deleon is a 47 year old Caucasian male, married, employed, lives in [place], has a history of PTSD, OSA on CPAP, presented to the clinic today for a follow-up visit.  Patient today reports he is currently doing well on the current medication regimen.  He reports he continues to have periods when he is irritable or have outbursts.  However he reports they are better than before.  He reports he does not have any trouble when he is at work.  He is able to function well.  However when he is back at home or with friends he does have some mood lability on and off.  He reports he is trying to work on that.  He however reports he likes the effect of the medications that he is on.    He is currently on a reduced dose of Seroquel.  He reports that has been helpful.  It does not make him too groggy when he wakes up in the morning.  He however does have some sleep problems.  He reports he does not wake up in the middle of the night and sleep is okay however he remembers his dreams.    He reports he does have a history of nightmares.  He reports that he used to wake him up in the middle of the night before .  He however reports they do not wake him up anymore however ever since he went down on the Seroquel he can remember them vividly.  He reports he does not want any medications yet when I discussed prazosin with him.  He reports he wants to give it more time.  He was able to get the labs as discussed with the exception of Depakote level.  His work schedule continues to be complicated and hence it is difficult for him to get Depakote labs now.  We will await.  Patient denies any suicidality.  Patient denies any perceptual disturbances.  Patient reports he has not been able to call Ms. Felecia Jan.  However he is interested in psychotherapy. Will send a  referral to Ms. Felecia Jan today.    Visit Diagnosis:    ICD-10-CM   1. PTSD (post-traumatic stress disorder) F43.10   2. Tobacco use disorder F17.200   3. MDD (major depressive disorder), recurrent episode, mild (HCC) F33.0     Past Psychiatric History: Reviewed past psychiatric history from my progress note on 05/29/2017.  Trials of Lamictal, Seroquel, Depakote, Effexor.  Past Medical History:  Past Medical History:  Diagnosis Date  . Anxiety   . Asthma   . Cardiac angina [facility])     Past Surgical History:  Procedure Laterality Date  . CARDIAC CATHETERIZATION N/A 12/27/2015   Procedure: Left Heart Cath and Coronary Angiography;  Surgeon: Yvonne Kendall, MD;  Location: [facility] INVASIVE CV LAB;  Service: Cardiovascular;  Laterality: N/A;  . EYE SURGERY Bilateral    cataract removals    Family Psychiatric History: Have reviewed family psychiatric history from my progress note on 05/29/2017  Family History:  Family History  Problem Relation Age of Onset  . Diabetes Mother   . Hepatitis C Mother   . Multiple sclerosis Mother   . Drug abuse Mother     Social History: He is married.  He is divorced once.  He patient lives in [place].  He lives with his wife and his 34-year-old child.  20 year old son from his previous marriage. He is a Emergency planning/management officer. He used to be in [employer] before. Social History   Socioeconomic History  . Marital status: Married    Spouse name: Not on file  . Number of children: Not on file  . Years of education: Not on file  . Highest education level: Not on file  Occupational History  . Not on file  Social Needs  . Financial resource strain: Not on file  . Food insecurity:    Worry: Not on file    Inability: Not on file  . Transportation needs:    Medical: Not on file    Non-medical: Not on file  Tobacco Use  . Smoking status: Former Smoker    Types: Cigarettes  . Smokeless tobacco: Current User    Types: Chew  . Tobacco comment: was an  occasional smoker, never a pack a day smoker  Substance and Sexual Activity  . Alcohol use: Yes    Comment: occasional   . Drug use: No  . Sexual activity: Not on file  Lifestyle  . Physical activity:    Days per week: Not on file    Minutes per session: Not on file  . Stress: Not on file  Relationships  . Social connections:    Talks on phone: Not on file    Gets together: Not on file    Attends religious service: Not on file    Active member of club or organization: Not on file    Attends meetings of clubs or organizations: Not on file    Relationship status: Not on file  Other Topics Concern  . Not on file  Social History Narrative  . Not on file    Allergies:  Allergies  Allergen Reactions  . Bupropion Other (See Comments)    Increased anger     Metabolic Disorder Labs: Lab Results  Component Value Date   HGBA1C 5.8 (H) 12/27/2015   MPG 120 12/27/2015   No results found for: PROLACTIN Lab Results  Component Value Date   CHOL 208 (H) 05/14/2016   TRIG 194 (H) 05/14/2016   HDL 43 05/14/2016   CHOLHDL 4.8 05/14/2016   VLDL 26 12/27/2015   LDLCALC 126 (H) 05/14/2016   LDLCALC 108 (H) 12/27/2015   Lab Results  Component Value Date   TSH 2.100 05/14/2016    Therapeutic Level Labs: No results found for: LITHIUM No results found for: VALPROATE No components found for:  CBMZ  Current Medications: Current Outpatient Medications  Medication Sig Dispense Refill  . divalproex (DEPAKOTE ER) 500 MG 24 hr tablet Take 1 tablet (500 mg total) by mouth at bedtime. 90 tablet 0  . ibuprofen (ADVIL) 200 MG tablet Take 200 mg by mouth every 6 (six) hours as needed.    . Lactobacillus (PROBIOTIC ACIDOPHILUS PO) Take by mouth.    . Multiple Vitamin (MULTIVITAMIN) tablet Take 1 tablet by mouth daily.    . Omega-3 Fatty Acids (FISH OIL) 1200 MG CPDR Take by mouth.    . QUEtiapine (SEROQUEL) 100 MG tablet Take 1.5 tablets (150 mg total) by mouth at bedtime. 135 tablet 0  .  venlafaxine XR (EFFEXOR-XR) 75 MG 24 hr capsule Take 3 capsules (225 mg total) by mouth at bedtime. 270 capsule 0   No current facility-administered medications for this visit.      Musculoskeletal: Strength & Muscle Tone:  within normal limits Gait & Station: normal Patient leans: N/A  Psychiatric Specialty Exam: Review of Systems  Psychiatric/Behavioral: Positive for depression.  All other systems reviewed and are negative.   Blood pressure (!) 137/96, pulse 82, height 5\' 11"  (1.803 m), weight 248 lb (112.5 kg), SpO2 94 %.Body mass index is 34.59 kg/m.  General Appearance: Casual  Eye Contact:  Fair  Speech:  Clear and Coherent  Volume:  Normal  Mood:  Dysphoric  Affect:  Congruent  Thought Process:  Goal Directed and Descriptions of Associations: Intact  Orientation:  Full (Time, Place, and Person)  Thought Content: Logical   Suicidal Thoughts:  No  Homicidal Thoughts:  No  Memory:  Immediate;   Fair Recent;   Fair Remote;   Fair  Judgement:  Fair  Insight:  Fair  Psychomotor Activity:  Normal  Concentration:  Concentration: Fair and Attention Span: Fair  Recall:  FiservFair  Fund of Knowledge: Fair  Language: Fair  Akathisia:  No  Handed:  Right  AIMS (if indicated):0  Assets:  Communication Skills Desire for Improvement Social Support  ADL's:  Intact  Cognition: WNL  Sleep:  Fair   Screenings:   Assessment and Plan: Trey PaulaJeff is a 47 year old Caucasian male who has a history of PTSD, OSA on CPAP, presented to the clinic today for a follow-up visit.  Patient reports he feels his symptoms as improved on the reduced dose of Seroquel.  He however continues to struggle with some nightmares .  Discussed medication readjustment with patient.  Patient however declines.  Discussed referral for psychotherapy.  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  Patient agrees with plan.  Plan PTSD Continue Depakote ER 500 mg p.o.  nightly. Continue Seroquel 150 mg p.o. nightly, reduced dosage. Continue Effexor XR 225 mg p.o. nightly.  MDD Continue medications as prescribed. Will send a referral to Ms. Felecia Janina Thompson.  Patient does have scheduling difficulty due to his shift changes at work.  Patient however agrees to reach out to her.  I have reviewed the following labs-TSH-within normal limits, vitamin B12-within normal limits, folate-within normal limits, vitamin D-low.  Patient advised to follow-up with his primary medical doctor-labs reported on 05/30/2017.  His Depakote level could not be ordered due to his shift changes at work.  Tobacco use disorder-chews tobacco We will continue to provide counseling.    Patient reports he is compliant with his CPAP.  He has OSA.  Follow-up in clinic in 7-8 weeks.  More than 50 % of the time was spent for psychoeducation and supportive psychotherapy and care coordination.  This note was generated in part or whole with voice recognition software. Voice recognition is usually quite accurate but there are transcription errors that can and very often do occur. I apologize for any typographical errors that were not detected and corrected.       Jomarie LongsSaramma Daquavion Catala, MD 07/04/2017, 8:57 AM

## 2017-07-04 ENCOUNTER — Encounter: Payer: Self-pay | Admitting: Psychiatry

## 2017-08-21 ENCOUNTER — Ambulatory Visit: Payer: 59 | Admitting: Psychiatry

## 2017-08-24 ENCOUNTER — Emergency Department
Admission: EM | Admit: 2017-08-24 | Discharge: 2017-08-24 | Disposition: A | Discharge status: Home / Self Care | Payer: Managed Care, Other (non HMO) | Attending: Emergency Medicine | Admitting: Emergency Medicine

## 2017-08-24 ENCOUNTER — Encounter: Payer: Self-pay | Admitting: Emergency Medicine

## 2017-08-24 DIAGNOSIS — E1165 Type 2 diabetes mellitus with hyperglycemia: Secondary | ICD-10-CM | POA: Diagnosis not present

## 2017-08-24 DIAGNOSIS — R739 Hyperglycemia, unspecified: Secondary | ICD-10-CM

## 2017-08-24 DIAGNOSIS — Z79899 Other long term (current) drug therapy: Secondary | ICD-10-CM | POA: Diagnosis not present

## 2017-08-24 DIAGNOSIS — J45909 Unspecified asthma, uncomplicated: Secondary | ICD-10-CM | POA: Insufficient documentation

## 2017-08-24 DIAGNOSIS — Z87891 Personal history of nicotine dependence: Secondary | ICD-10-CM | POA: Diagnosis not present

## 2017-08-24 DIAGNOSIS — E119 Type 2 diabetes mellitus without complications: Secondary | ICD-10-CM

## 2017-08-24 LAB — CBC
HCT: 42.3 % (ref 40.0–52.0)
Hemoglobin: 14.7 g/dL (ref 13.0–18.0)
MCH: 30.8 pg (ref 26.0–34.0)
MCHC: 34.7 g/dL (ref 32.0–36.0)
MCV: 88.9 fL (ref 80.0–100.0)
PLATELETS: 227 10*3/uL (ref 150–440)
RBC: 4.76 MIL/uL (ref 4.40–5.90)
RDW: 12.7 % (ref 11.5–14.5)
WBC: 7.4 10*3/uL (ref 3.8–10.6)

## 2017-08-24 LAB — BASIC METABOLIC PANEL
Anion gap: 12 (ref 5–15)
BUN: 14 mg/dL (ref 6–20)
CHLORIDE: 93 mmol/L — AB (ref 98–111)
CO2: 26 mmol/L (ref 22–32)
CREATININE: 1.05 mg/dL (ref 0.61–1.24)
Calcium: 8.7 mg/dL — ABNORMAL LOW (ref 8.9–10.3)
GFR calc Af Amer: >60 mL/min (ref >60)
Glucose, Bld: 571 mg/dL (ref 70–99)
POTASSIUM: 3.9 mmol/L (ref 3.5–5.1)
SODIUM: 131 mmol/L — AB (ref 135–145)

## 2017-08-24 LAB — URINALYSIS, COMPLETE (UACMP) WITH MICROSCOPIC
BILIRUBIN URINE: NEGATIVE
Bacteria, UA: NONE SEEN
HGB URINE DIPSTICK: NEGATIVE
KETONES UR: NEGATIVE mg/dL
LEUKOCYTES UA: NEGATIVE
NITRITE: NEGATIVE
PH: 6 (ref 5.0–8.0)
Protein, ur: NEGATIVE mg/dL
SQUAMOUS EPITHELIAL / LPF: NONE SEEN (ref 0–5)
Specific Gravity, Urine: 1.03 (ref 1.005–1.030)

## 2017-08-24 LAB — GLUCOSE, CAPILLARY
GLUCOSE-CAPILLARY: 434 mg/dL — AB (ref 70–99)
Glucose-Capillary: 532 mg/dL (ref 70–99)
Glucose-Capillary: 346 mg/dL — ABNORMAL HIGH (ref 70–99)

## 2017-08-24 MED ORDER — LIVING WELL WITH DIABETES BOOK
Freq: Once | Status: DC
  Filled 2017-08-24: qty 1

## 2017-08-24 MED ORDER — SODIUM CHLORIDE 0.9 % IV BOLUS
1000.0000 mL | Freq: Once | INTRAVENOUS | Status: AC
  Administered 2017-08-24: 1000 mL via INTRAVENOUS

## 2017-08-24 MED ORDER — METFORMIN HCL 500 MG PO TABS
500.0000 mg | ORAL_TABLET | Freq: Once | ORAL | Status: AC
  Administered 2017-08-24: 500 mg via ORAL
  Filled 2017-08-24: qty 1

## 2017-08-24 MED ORDER — INSULIN ASPART 100 UNIT/ML ~~LOC~~ SOLN
3.0000 [IU] | Freq: Once | SUBCUTANEOUS | Status: AC
Start: 2017-08-24 — End: 2017-08-24
  Administered 2017-08-24: 3 [IU] via INTRAVENOUS
  Filled 2017-08-24: qty 1

## 2017-08-24 MED ORDER — METFORMIN HCL 500 MG PO TABS: 500.0000 mg | ORAL_TABLET | Freq: Two times a day (BID) | ORAL | 11 refills | Status: DC

## 2017-08-24 NOTE — ED Provider Notes (Signed)
[facility] Emergency Department Provider Note   ____________________________________________   First MD Initiated Contact with Patient 08/24/17 1456     (approximate)  I have reviewed the triage vital signs and the nursing notes.   HISTORY  Chief Complaint Hyperglycemia    HPI Tyler Deleon is a 47 y.o. male patient complaining of 4 days of feeling tired and just bad all over.  Frequent urination thirst blurry vision some sweating.  He has not had this before.  He said he felt even worse today could not stop going to the bathroom to urinate and could not get enough to drink so he came in here.  He denies any pain.   Past Medical History:  Diagnosis Date  . Anxiety   . Asthma   . Cardiac angina [facility])     Patient Active Problem List   Diagnosis Date Noted  . Other chest pain 01/21/2016  . Diastolic dysfunction 01/21/2016  . Unstable angina (HCC) 12/27/2015    Past Surgical History:  Procedure Laterality Date  . CARDIAC CATHETERIZATION N/A 12/27/2015   Procedure: Left Heart Cath and Coronary Angiography;  Surgeon: Yvonne Kendall, MD;  Location: [facility] INVASIVE CV LAB;  Service: Cardiovascular;  Laterality: N/A;  . EYE SURGERY Bilateral    cataract removals    Prior to Admission medications   Medication Sig Start Date End Date Taking? Authorizing Provider  divalproex (DEPAKOTE ER) 500 MG 24 hr tablet Take 1 tablet (500 mg total) by mouth at bedtime. 05/29/17   Jomarie Longs, MD  ibuprofen (ADVIL) 200 MG tablet Take 200 mg by mouth every 6 (six) hours as needed.    [provider]  Lactobacillus (PROBIOTIC ACIDOPHILUS PO) Take by mouth.    [provider]  metFORMIN (GLUCOPHAGE) 500 MG tablet Take 1 tablet (500 mg total) by mouth 2 (two) times daily with a meal. 08/24/17 08/24/18  Arnaldo Natal, MD  Multiple Vitamin (MULTIVITAMIN) tablet Take 1 tablet by mouth daily.    [provider]  Omega-3 Fatty Acids (FISH OIL)  1200 MG CPDR Take by mouth.    [provider]  QUEtiapine (SEROQUEL) 100 MG tablet Take 1.5 tablets (150 mg total) by mouth at bedtime. 05/29/17   Jomarie Longs, MD  venlafaxine XR (EFFEXOR-XR) 75 MG 24 hr capsule Take 3 capsules (225 mg total) by mouth at bedtime. 05/29/17   Jomarie Longs, MD    Allergies Bupropion  Family History  Problem Relation Age of Onset  . Diabetes Mother   . Hepatitis C Mother   . Multiple sclerosis Mother   . Drug abuse Mother     Social History Social History   Tobacco Use  . Smoking status: Former Smoker    Types: Cigarettes  . Smokeless tobacco: Current User    Types: Chew  . Tobacco comment: was an occasional smoker, never a pack a day smoker  Substance Use Topics  . Alcohol use: Yes    Comment: occasional   . Drug use: No    Review of Systems  Constitutional: No fever/chills Eyes: No visual changes. ENT: No sore throat. Cardiovascular: Denies chest pain. Respiratory: Denies shortness of breath. Gastrointestinal: No abdominal pain.  No nausea, no vomiting.  No diarrhea.  No constipation. Genitourinary: Negative for dysuria. Musculoskeletal: Negative for back pain. Skin: Negative for rash. Neurological: Negative for headaches, focal weakness   ____________________________________________   PHYSICAL EXAM:  VITAL SIGNS: ED Triage Vitals  Enc Vitals Group  BP 08/24/17 1316 (!) 144/98     Pulse Rate 08/24/17 1316 76     Resp 08/24/17 1316 18     Temp 08/24/17 1316 98.8 F (37.1 C)     Temp Source 08/24/17 1316 Oral     SpO2 08/24/17 1316 96 %     Weight 08/24/17 1319 238 lb (108 kg)     Height 08/24/17 1319 5\' 11"  (1.803 m)     Head Circumference --      Peak Flow --      Pain Score 08/24/17 1317 0     Pain Loc --      Pain Edu? --      Excl. in GC? --     Constitutional: Alert and oriented. Well appearing and in no acute distress. Eyes: Conjunctivae are normal.  Head: Atraumatic. Nose: No  congestion/rhinnorhea. Mouth/Throat: Mucous membranes are moist.  Oropharynx non-erythematous. Neck: No stridor.   Cardiovascular: Normal rate, regular rhythm. Grossly normal heart sounds.  Good peripheral circulation. Respiratory: Normal respiratory effort.  No retractions. Lungs CTAB. Gastrointestinal: Soft and nontender. No distention. No abdominal bruits. No CVA tenderness. Musculoskeletal: No lower extremity tenderness nor edema.  No joint effusions. Neurologic:  Normal speech and language. No gross focal neurologic deficits are appreciated. No gait instability. Skin:  Skin is warm, dry and intact. No rash noted. Psychiatric: Mood and affect are normal. Speech and behavior are normal.  ____________________________________________   LABS (all labs ordered are listed, but only abnormal results are displayed)  Labs Reviewed  BASIC METABOLIC PANEL - Abnormal; Notable for the following components:      Result Value   Sodium 131 (*)    Chloride 93 (*)    Glucose, Bld 571 (*)    Calcium 8.7 (*)    All other components within normal limits  URINALYSIS, COMPLETE (UACMP) WITH MICROSCOPIC - Abnormal; Notable for the following components:   Color, Urine STRAW (*)    APPearance CLEAR (*)    Glucose, UA >=500 (*)    All other components within normal limits  GLUCOSE, CAPILLARY - Abnormal; Notable for the following components:   Glucose-Capillary 532 (*)    All other components within normal limits  GLUCOSE, CAPILLARY - Abnormal; Notable for the following components:   Glucose-Capillary 434 (*)    All other components within normal limits  CBC  CBG MONITORING, ED   ____________________________________________  EKG   ____________________________________________  RADIOLOGY  ED MD interpretation:    Official radiology report(s): No results found.  ____________________________________________   PROCEDURES  Procedure(s) performed:   Procedures  Critical Care performed:     ____________________________________________   INITIAL IMPRESSION / ASSESSMENT AND PLAN / ED COURSE  Patient with new onset diabetes we will plan on getting his blood sugar to go down and getting him follow-up with his doctor he has a physician at [facility] clinic.  Try to get him started on metformin and go from there.      ____________________________________________   FINAL CLINICAL IMPRESSION(S) / ED DIAGNOSES  Final diagnoses:  Hyperglycemia  Diabetes mellitus, new onset [facility])     ED Discharge Orders         Ordered    metFORMIN (GLUCOPHAGE) 500 MG tablet  2 times daily with meals     08/24/17 1522           Note:  This document was prepared using Dragon voice recognition software and may include unintentional dictation errors.    Arnaldo Natal,  MD 08/24/17 [id number]

## 2017-08-24 NOTE — ED Notes (Signed)
Pt medicated as ordered with insulin and 2nd liter started. Pt upset that his [facility] dr "didn't tell him he was prediabetic". [facility] Let him know that was okay, especially with being a new diabetic and that [facility] would [facility].

## 2017-08-24 NOTE — Discharge Instructions (Signed)
Please start the metformin 1 pill twice a day.  Monitor your food intake.  Call your doctor and get follow[facility].  Call them when you get out of here or first thing in the morning tomorrow.  They can get you into the diabetic teaching program here at the hospital.  By glucometer check your fingersticks 4 times a day and record the readings in a notebook.  Check fingersticks before breakfast, before lunch and before dinner and before you go to bed.  This information will help your doctor manage your diabetes.

## 2017-08-24 NOTE — ED Triage Notes (Signed)
Patient presents to the ED with feeling badly since Thursday.  Patient reports frequent urination, diaphoresis and thirsty with blurred vision.  Patient is also complaining of weakness.

## 2017-08-28 ENCOUNTER — Ambulatory Visit: Payer: 59 | Admitting: Psychiatry

## 2017-08-31 ENCOUNTER — Other Ambulatory Visit: Payer: Self-pay | Admitting: Psychiatry

## 2017-08-31 DIAGNOSIS — F17200 Nicotine dependence, unspecified, uncomplicated: Secondary | ICD-10-CM

## 2017-10-10 ENCOUNTER — Other Ambulatory Visit: Payer: Self-pay | Admitting: Gastroenterology

## 2017-10-10 ENCOUNTER — Ambulatory Visit
Admission: RE | Admit: 2017-10-10 | Discharge: 2017-10-10 | Disposition: A | Discharge status: Home / Self Care | Payer: Managed Care, Other (non HMO) | Source: Ambulatory Visit | Attending: Gastroenterology | Admitting: Gastroenterology

## 2017-10-10 DIAGNOSIS — R93422 Abnormal radiologic findings on diagnostic imaging of left kidney: Secondary | ICD-10-CM | POA: Diagnosis not present

## 2017-10-10 DIAGNOSIS — R1032 Left lower quadrant pain: Secondary | ICD-10-CM | POA: Insufficient documentation

## 2017-10-10 HISTORY — DX: Type 2 diabetes mellitus without complications: E11.9

## 2017-10-10 LAB — POCT I-STAT CREATININE: Creatinine, Ser: 1 mg/dL (ref 0.61–1.24)

## 2017-10-10 MED ORDER — IOPAMIDOL (ISOVUE-300) INJECTION 61%
100.0000 mL | Freq: Once | INTRAVENOUS | Status: AC | PRN
  Administered 2017-10-10: 100 mL via INTRAVENOUS

## 2017-10-14 ENCOUNTER — Other Ambulatory Visit: Payer: Self-pay | Admitting: Internal Medicine

## 2017-10-14 DIAGNOSIS — N281 Cyst of kidney, acquired: Secondary | ICD-10-CM

## 2017-10-23 ENCOUNTER — Ambulatory Visit
Admission: RE | Admit: 2017-10-23 | Discharge: 2017-10-23 | Disposition: A | Discharge status: Home / Self Care | Payer: Managed Care, Other (non HMO) | Source: Ambulatory Visit | Attending: Internal Medicine | Admitting: Internal Medicine

## 2017-10-23 ENCOUNTER — Encounter (INDEPENDENT_AMBULATORY_CARE_PROVIDER_SITE_OTHER): Payer: Self-pay

## 2017-10-23 DIAGNOSIS — N281 Cyst of kidney, acquired: Secondary | ICD-10-CM

## 2017-12-07 ENCOUNTER — Other Ambulatory Visit: Payer: Self-pay | Admitting: Psychiatry

## 2017-12-11 ENCOUNTER — Other Ambulatory Visit: Payer: Self-pay | Admitting: Psychiatry

## 2017-12-11 MED ORDER — QUETIAPINE FUMARATE 100 MG PO TABS: ORAL_TABLET | ORAL | 0 refills | Status: DC

## 2017-12-11 NOTE — Telephone Encounter (Signed)
Pt needs appointment prior to further refills.

## 2018-01-05 ENCOUNTER — Other Ambulatory Visit: Payer: Self-pay | Admitting: Psychiatry

## 2018-03-09 ENCOUNTER — Other Ambulatory Visit: Payer: Self-pay | Admitting: Psychiatry

## 2018-03-17 ENCOUNTER — Other Ambulatory Visit: Payer: Self-pay | Admitting: Psychiatry

## 2018-03-17 DIAGNOSIS — F17200 Nicotine dependence, unspecified, uncomplicated: Secondary | ICD-10-CM

## 2019-06-09 ENCOUNTER — Ambulatory Visit: Payer: Self-pay | Attending: Internal Medicine

## 2019-06-09 DIAGNOSIS — Z23 Encounter for immunization: Secondary | ICD-10-CM

## 2019-06-09 NOTE — Progress Notes (Signed)
   Covid-19 Vaccination Clinic  Name:  Tyler Deleon    MRN: [id number] DOB: 1970-08-19  06/09/2019  Mr. Tyler Deleon was observed post Covid-19 immunization for 15 minutes without incident. He was provided with Vaccine Information Sheet and instruction to access the V-Safe system.   Mr. Tyler Deleon was instructed to call 911 with any severe reactions post vaccine: Marland Kitchen Difficulty breathing  . Swelling of face and throat  . A fast heartbeat  . A bad rash all over body  . Dizziness and weakness   Immunizations Administered    Name Date Dose VIS Date Route   Pfizer COVID-19 Vaccine 06/09/2019  9:42 AM 0.3 mL 03/11/2018 Intramuscular   Manufacturer: [employer], [employer]   Lot: [id number]   NDC: [address]-9

## 2019-06-21 IMAGING — US US RENAL
2 series · 14 of 25 positions shown · non-contrast
Comparison: 10/10/2017 abdominal CT

CLINICAL DATA: Follow-up left renal cyst

EXAM:
RENAL / URINARY TRACT ULTRASOUND COMPLETE

[Series 1: us renal · 0.26mm/px · 13 of 35 slices shown (1 of 2)]
[im 1/35]
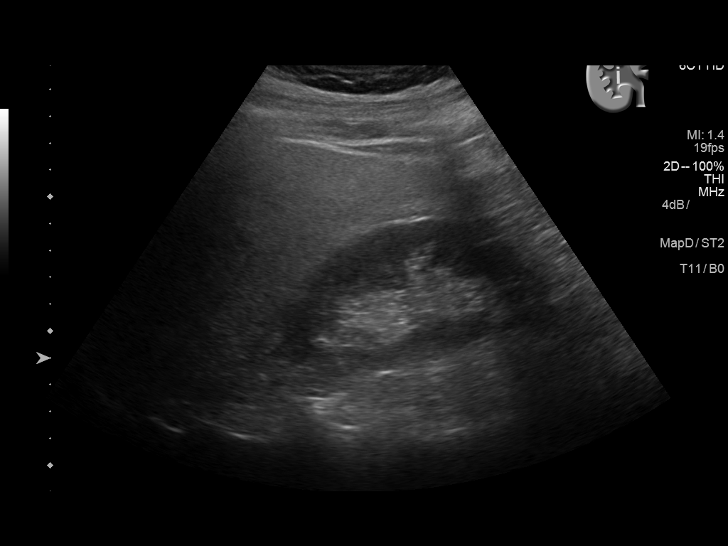
[im 3/35]
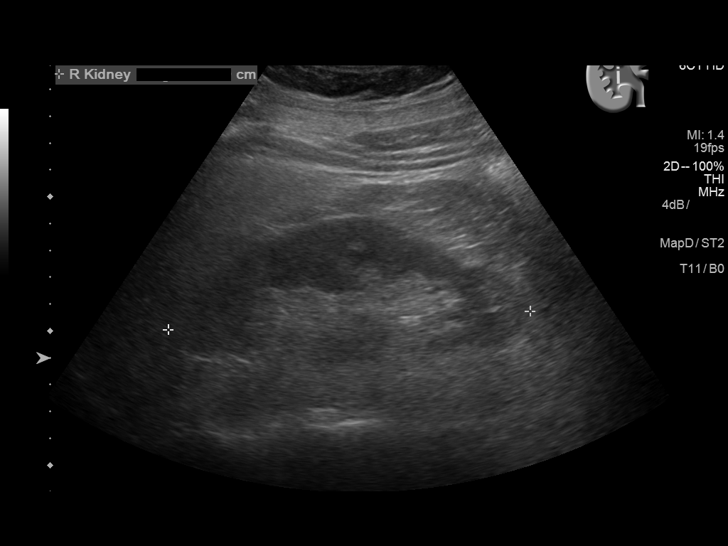
[im 6/35]
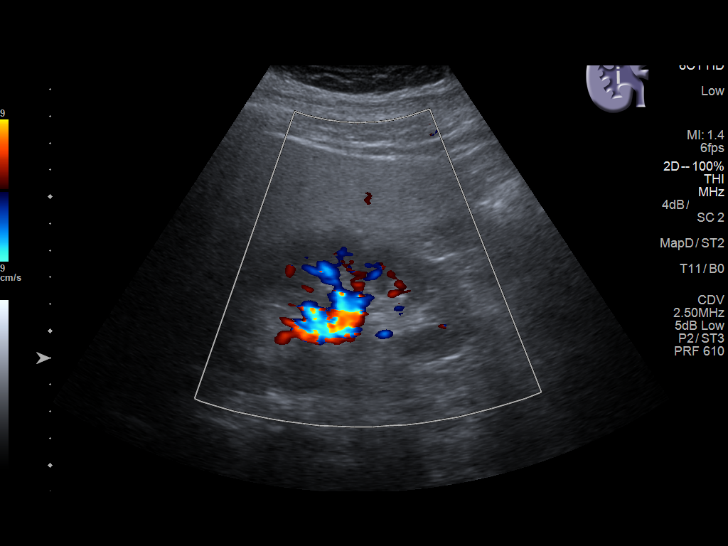
[im 9/35]
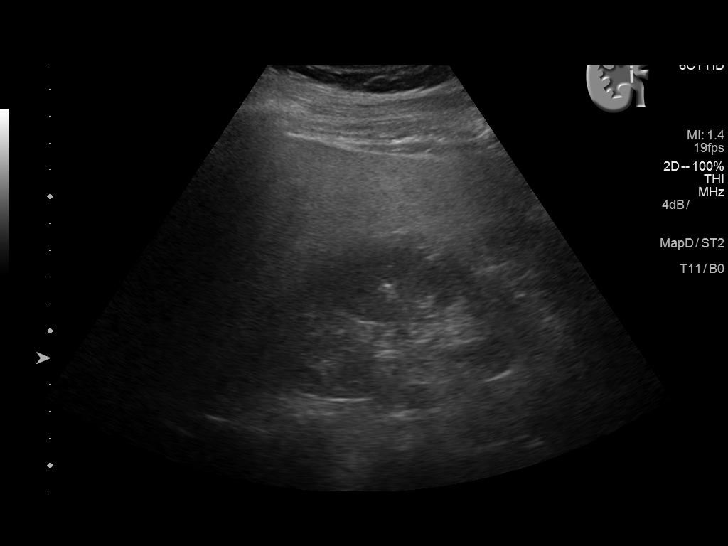
[im 12/35]
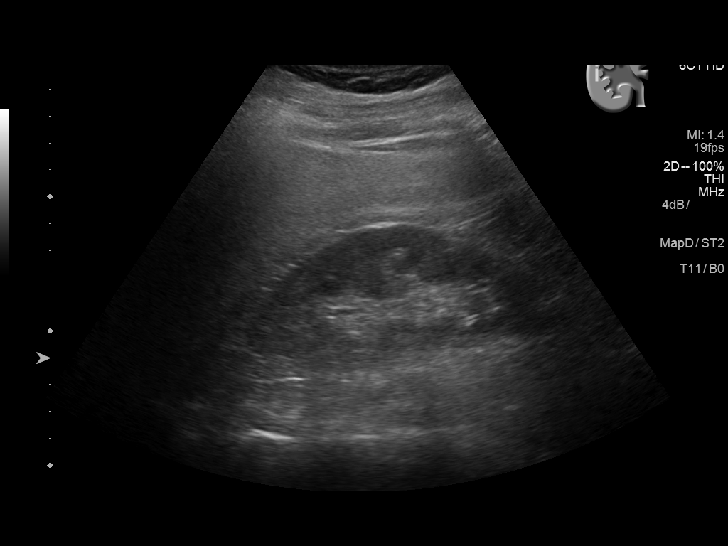
[im 14/35]
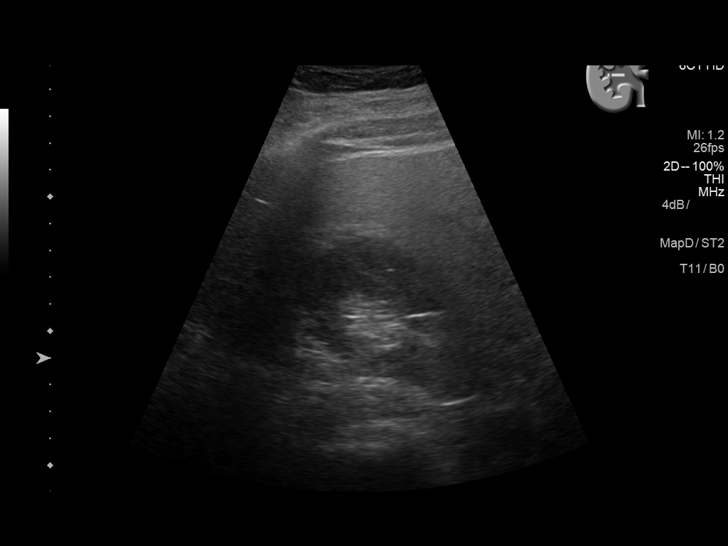
[im 17/35]
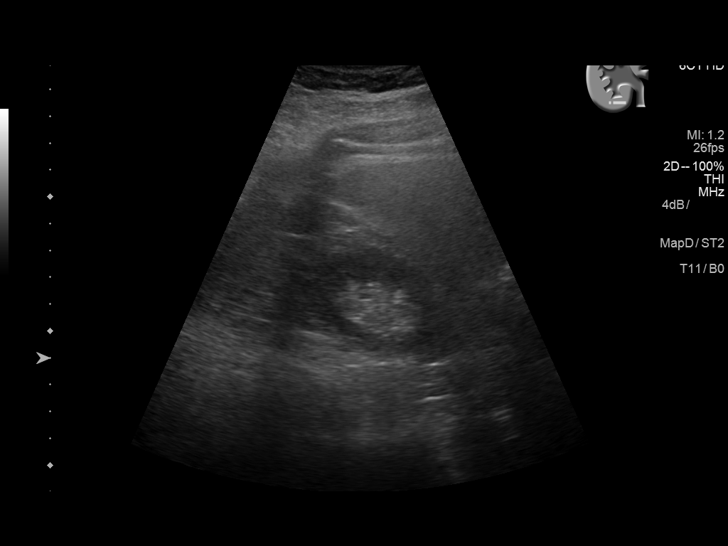
[im 20/35]
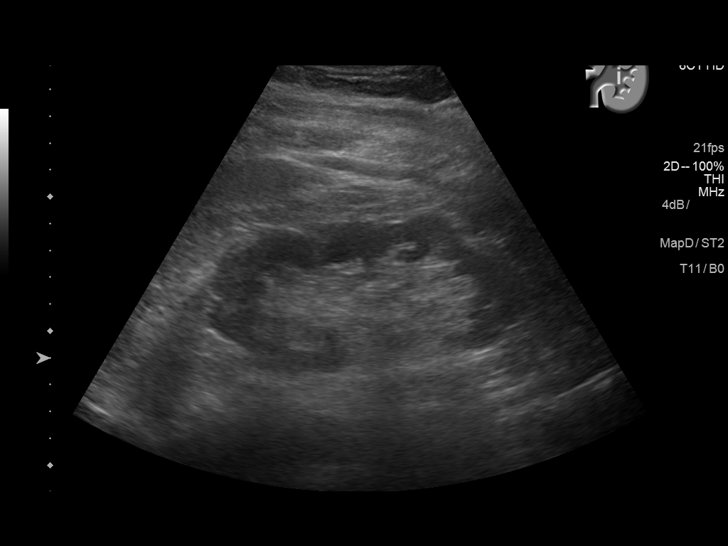
[im 23/35]
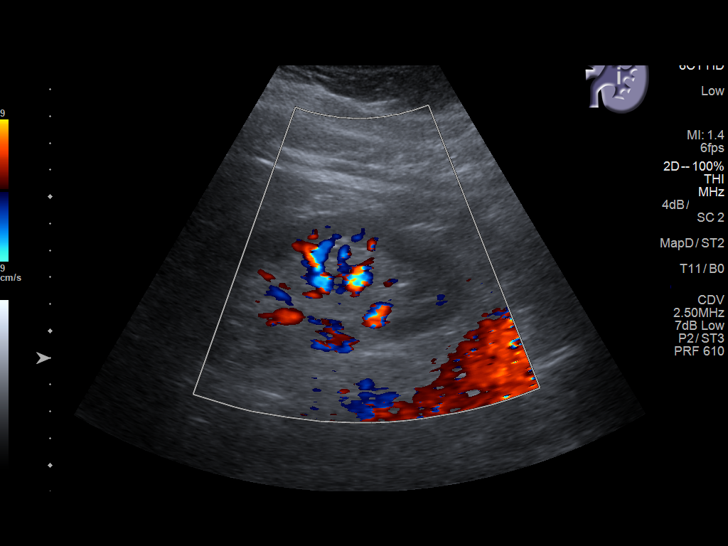
[im 24/35]
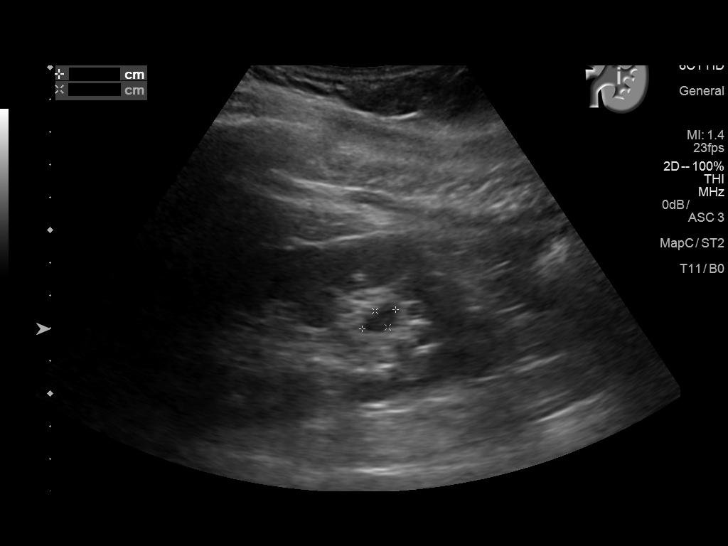
[im 27/35]
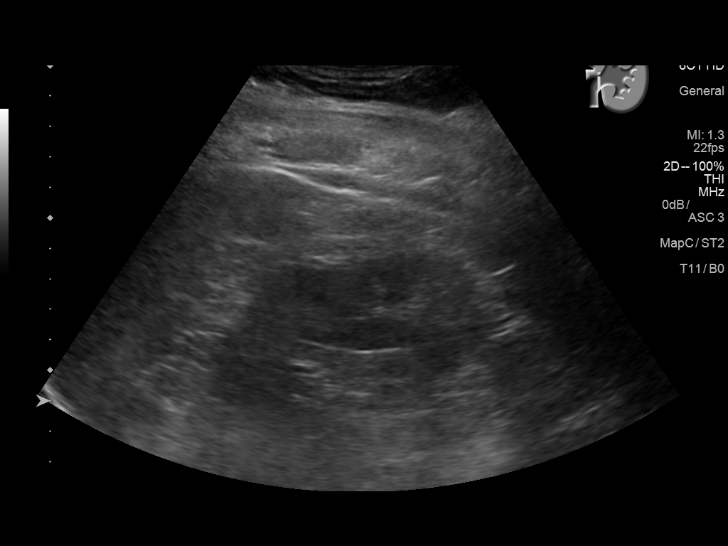
[im 30/35]
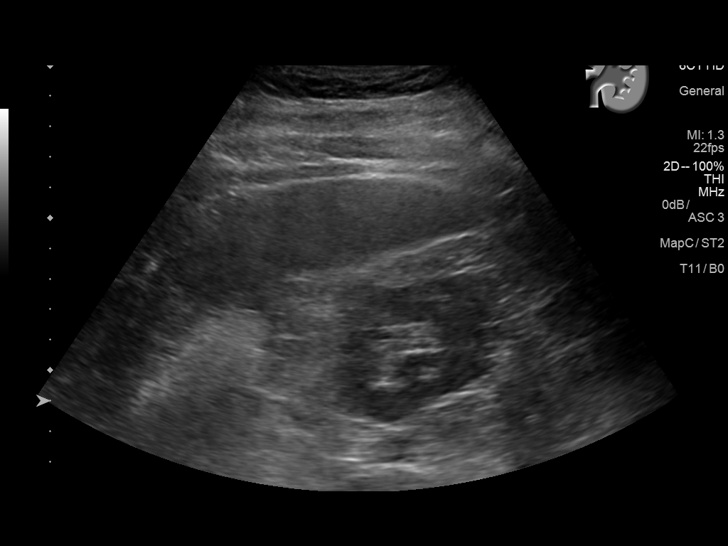
[im 33/35]
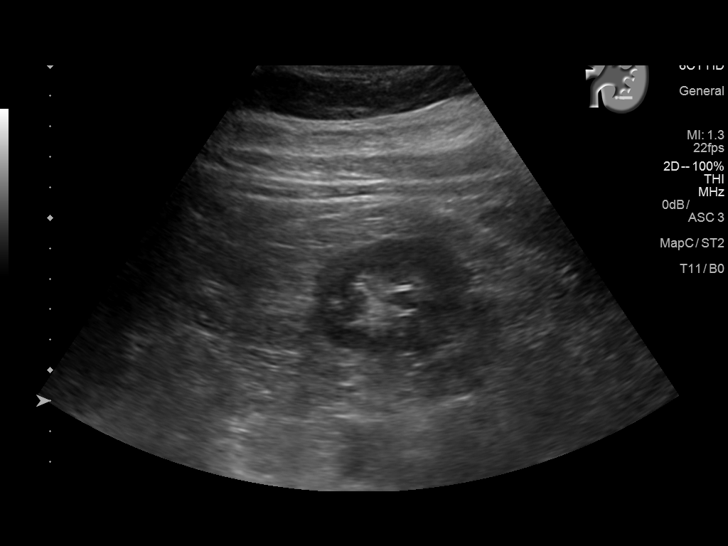

[Series 2001: us renal · 0.23mm/px · 1 of 1 slices shown (2 of 2)]
[im 1/1]
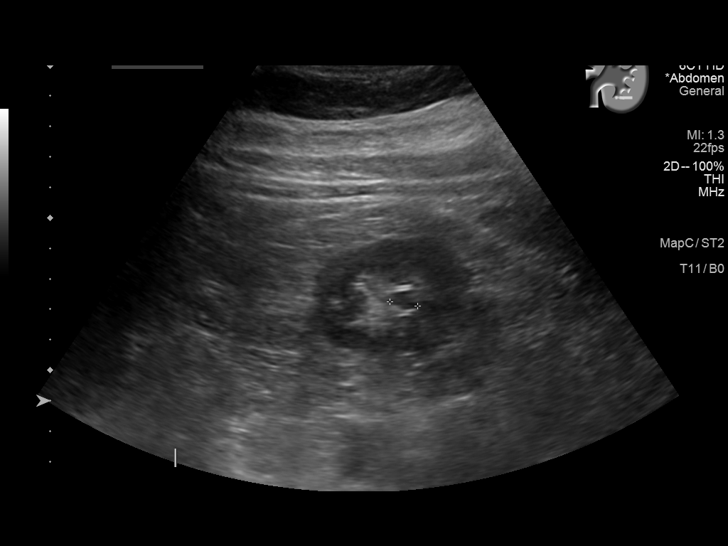

[14 of 25 positions shown; findings below may reference images not displayed]

FINDINGS: Right Kidney:

Length: 13.5 cm. Echogenicity within normal limits. No mass or
hydronephrosis visualized.

Left Kidney:

Length: 12 cm. Benign lower pole renal sinus cysts, more extensive
by CT. Solitary visualized cyst by ultrasound measuring 12 mm in
maximal. No solid mass. No hydronephrosis.

Bladder:

Appears normal for degree of bladder distention.
IMPRESSION: Small benign left renal hilar cysts.  No suspicious abnormality.

## 2019-12-18 ENCOUNTER — Ambulatory Visit: Payer: Self-pay | Attending: Internal Medicine

## 2019-12-18 ENCOUNTER — Other Ambulatory Visit: Payer: Self-pay | Admitting: Internal Medicine

## 2019-12-18 DIAGNOSIS — Z23 Encounter for immunization: Secondary | ICD-10-CM

## 2019-12-18 NOTE — Progress Notes (Signed)
   Covid-19 Vaccination Clinic  Name:  NOLYN SWAB    MRN: [id number] DOB: Mar 07, 1970  12/18/2019  Mr. Mcnab was observed post Covid-19 immunization for 15 minutes without incident. He was provided with Vaccine Information Sheet and instruction to access the V-Safe system.   Mr. Biller was instructed to call 911 with any severe reactions post vaccine: Marland Kitchen Difficulty breathing  . Swelling of face and throat  . A fast heartbeat  . A bad rash all over body  . Dizziness and weakness   Immunizations Administered    Name Date Dose VIS Date Route   Pfizer COVID-19 Vaccine 12/18/2019  9:01 AM 0.3 mL 11/04/2019 Intramuscular   Manufacturer: [employer], [employer]   Lot: [id number]   NDC: [address]-0

## 2023-09-30 ENCOUNTER — Observation Stay

## 2023-09-30 ENCOUNTER — Encounter
Admission: EM | Disposition: A | Discharge status: Home / Self Care | Payer: Self-pay | Source: Home / Self Care | Attending: Emergency Medicine

## 2023-09-30 ENCOUNTER — Other Ambulatory Visit: Payer: Self-pay

## 2023-09-30 ENCOUNTER — Emergency Department

## 2023-09-30 ENCOUNTER — Encounter: Payer: Self-pay | Admitting: General Surgery

## 2023-09-30 ENCOUNTER — Observation Stay
Admission: EM | Admit: 2023-09-30 | Discharge: 2023-09-30 | Disposition: A | Discharge status: Home / Self Care | Attending: General Surgery | Admitting: General Surgery

## 2023-09-30 DIAGNOSIS — I10 Essential (primary) hypertension: Secondary | ICD-10-CM | POA: Insufficient documentation

## 2023-09-30 DIAGNOSIS — E119 Type 2 diabetes mellitus without complications: Secondary | ICD-10-CM | POA: Diagnosis not present

## 2023-09-30 DIAGNOSIS — K353 Acute appendicitis with localized peritonitis, without perforation or gangrene: Principal | ICD-10-CM | POA: Insufficient documentation

## 2023-09-30 DIAGNOSIS — J45909 Unspecified asthma, uncomplicated: Secondary | ICD-10-CM | POA: Insufficient documentation

## 2023-09-30 DIAGNOSIS — K3580 Unspecified acute appendicitis: Secondary | ICD-10-CM | POA: Diagnosis present

## 2023-09-30 DIAGNOSIS — F1090 Alcohol use, unspecified, uncomplicated: Secondary | ICD-10-CM | POA: Insufficient documentation

## 2023-09-30 DIAGNOSIS — Z79899 Other long term (current) drug therapy: Secondary | ICD-10-CM | POA: Diagnosis not present

## 2023-09-30 DIAGNOSIS — D360 Benign neoplasm of lymph nodes: Secondary | ICD-10-CM | POA: Diagnosis not present

## 2023-09-30 DIAGNOSIS — Z87891 Personal history of nicotine dependence: Secondary | ICD-10-CM | POA: Insufficient documentation

## 2023-09-30 LAB — CBC
HCT: 42.6 % (ref 39.0–52.0)
Hemoglobin: 14.2 g/dL (ref 13.0–17.0)
MCH: 30 pg (ref 26.0–34.0)
MCHC: 33.3 g/dL (ref 30.0–36.0)
MCV: 90.1 fL (ref 80.0–100.0)
Platelets: 220 K/uL (ref 150–400)
RBC: 4.73 MIL/uL (ref 4.22–5.81)
RDW: 12.3 % (ref 11.5–15.5)
WBC: 11.7 K/uL — ABNORMAL HIGH (ref 4.0–10.5)
nRBC: 0 % (ref 0.0–0.2)

## 2023-09-30 LAB — COMPREHENSIVE METABOLIC PANEL WITH GFR
ALT: 18 U/L (ref 0–44)
AST: 20 U/L (ref 15–41)
Albumin: 4.2 g/dL (ref 3.5–5.0)
Alkaline Phosphatase: 73 U/L (ref 38–126)
Anion gap: 8 (ref 5–15)
BUN: 25 mg/dL — ABNORMAL HIGH (ref 6–20)
CO2: 25 mmol/L (ref 22–32)
Calcium: 8.5 mg/dL — ABNORMAL LOW (ref 8.9–10.3)
Chloride: 105 mmol/L (ref 98–111)
Creatinine, Ser: 1.06 mg/dL (ref 0.61–1.24)
GFR, Estimated: >60 mL/min (ref >60)
Glucose, Bld: 133 mg/dL — ABNORMAL HIGH (ref 70–99)
Potassium: 3.7 mmol/L (ref 3.5–5.1)
Sodium: 138 mmol/L (ref 135–145)
Total Bilirubin: 1.1 mg/dL (ref 0.0–1.2)
Total Protein: 7.1 g/dL (ref 6.5–8.1)

## 2023-09-30 LAB — CBG MONITORING, ED
Glucose-Capillary: 91 mg/dL (ref 70–99)
Glucose-Capillary: 155 mg/dL — ABNORMAL HIGH (ref 70–99)

## 2023-09-30 LAB — LIPASE, BLOOD: Lipase: 32 U/L (ref 11–51)

## 2023-09-30 SURGERY — APPENDECTOMY, LAPAROSCOPIC
Anesthesia: General

## 2023-09-30 MED ORDER — GLYCOPYRROLATE 0.2 MG/ML IJ SOLN
INTRAMUSCULAR | Status: DC | PRN
  Administered 2023-09-30: .1 mg via INTRAVENOUS

## 2023-09-30 MED ORDER — MIDAZOLAM HCL 2 MG/2ML IJ SOLN
INTRAMUSCULAR | Status: DC | PRN
  Administered 2023-09-30: 2 mg via INTRAVENOUS

## 2023-09-30 MED ORDER — ACETAMINOPHEN 500 MG PO TABS
1000.0000 mg | ORAL_TABLET | Freq: Four times a day (QID) | ORAL | Status: DC
  Administered 2023-09-30: 1000 mg via ORAL
  Filled 2023-09-30: qty 2

## 2023-09-30 MED ORDER — BUPIVACAINE-EPINEPHRINE (PF) 0.5% -1:200000 IJ SOLN
INTRAMUSCULAR | Status: DC | PRN
  Administered 2023-09-30: 30 mL

## 2023-09-30 MED ORDER — ONDANSETRON 4 MG PO TBDP: 4.0000 mg | ORAL_TABLET | Freq: Four times a day (QID) | ORAL | Status: DC | PRN

## 2023-09-30 MED ORDER — MORPHINE SULFATE (PF) 4 MG/ML IV SOLN
4.0000 mg | Freq: Once | INTRAVENOUS | Status: AC
  Administered 2023-09-30: 4 mg via INTRAVENOUS
  Filled 2023-09-30: qty 1

## 2023-09-30 MED ORDER — BUPIVACAINE-EPINEPHRINE (PF) 0.5% -1:200000 IJ SOLN
INTRAMUSCULAR | Status: AC
  Filled 2023-09-30: qty 30

## 2023-09-30 MED ORDER — KETAMINE HCL 50 MG/5ML IJ SOSY
PREFILLED_SYRINGE | INTRAMUSCULAR | Status: AC
  Filled 2023-09-30: qty 5

## 2023-09-30 MED ORDER — PROPOFOL 10 MG/ML IV BOLUS
INTRAVENOUS | Status: AC
  Filled 2023-09-30: qty 20

## 2023-09-30 MED ORDER — HYDROMORPHONE HCL 1 MG/ML IJ SOLN
INTRAMUSCULAR | Status: AC
  Filled 2023-09-30: qty 1

## 2023-09-30 MED ORDER — ROCURONIUM BROMIDE 10 MG/ML (PF) SYRINGE
PREFILLED_SYRINGE | INTRAVENOUS | Status: AC
  Filled 2023-09-30: qty 10

## 2023-09-30 MED ORDER — LIDOCAINE HCL (PF) 2 % IJ SOLN
INTRAMUSCULAR | Status: AC
  Filled 2023-09-30: qty 5

## 2023-09-30 MED ORDER — KETOROLAC TROMETHAMINE 15 MG/ML IJ SOLN
15.0000 mg | Freq: Once | INTRAMUSCULAR | Status: AC
  Administered 2023-09-30: 15 mg via INTRAVENOUS
  Filled 2023-09-30: qty 1

## 2023-09-30 MED ORDER — ONDANSETRON HCL 4 MG/2ML IJ SOLN
INTRAMUSCULAR | Status: DC | PRN
Start: 2023-09-30 — End: 2023-09-30
  Administered 2023-09-30: 4 mg via INTRAVENOUS

## 2023-09-30 MED ORDER — FENTANYL CITRATE (PF) 100 MCG/2ML IJ SOLN: 25.0000 ug | INTRAMUSCULAR | Status: DC | PRN

## 2023-09-30 MED ORDER — OXYCODONE HCL 5 MG PO TABS
5.0000 mg | ORAL_TABLET | ORAL | Status: DC | PRN
  Administered 2023-09-30: 10 mg via ORAL
  Filled 2023-09-30: qty 2

## 2023-09-30 MED ORDER — OXYCODONE HCL 5 MG/5ML PO SOLN: 5.0000 mg | Freq: Once | ORAL | Status: DC | PRN

## 2023-09-30 MED ORDER — PROPOFOL 10 MG/ML IV BOLUS
INTRAVENOUS | Status: DC | PRN
  Administered 2023-09-30: 150 mg via INTRAVENOUS

## 2023-09-30 MED ORDER — MORPHINE SULFATE (PF) 2 MG/ML IV SOLN
2.0000 mg | INTRAVENOUS | Status: DC | PRN
  Administered 2023-09-30: 2 mg via INTRAVENOUS
  Filled 2023-09-30: qty 1

## 2023-09-30 MED ORDER — LACTATED RINGERS IV BOLUS
1000.0000 mL | Freq: Once | INTRAVENOUS | Status: AC
  Administered 2023-09-30: 1000 mL via INTRAVENOUS

## 2023-09-30 MED ORDER — 0.9 % SODIUM CHLORIDE (POUR BTL) OPTIME
TOPICAL | Status: DC | PRN
  Administered 2023-09-30: 500 mL

## 2023-09-30 MED ORDER — LACTATED RINGERS IV SOLN: INTRAVENOUS | Status: DC

## 2023-09-30 MED ORDER — EPHEDRINE SULFATE-NACL 50-0.9 MG/10ML-% IV SOSY
PREFILLED_SYRINGE | INTRAVENOUS | Status: DC | PRN
  Administered 2023-09-30 (×2): 5 mg via INTRAVENOUS

## 2023-09-30 MED ORDER — OXYCODONE HCL 5 MG PO TABS: 5.0000 mg | ORAL_TABLET | Freq: Four times a day (QID) | ORAL | 0 refills | Status: DC | PRN

## 2023-09-30 MED ORDER — KETAMINE HCL 50 MG/5ML IJ SOSY
PREFILLED_SYRINGE | INTRAMUSCULAR | Status: DC | PRN
  Administered 2023-09-30: 40 mg via INTRAVENOUS

## 2023-09-30 MED ORDER — ONDANSETRON HCL 4 MG/2ML IJ SOLN
4.0000 mg | INTRAMUSCULAR | Status: AC
  Administered 2023-09-30: 4 mg via INTRAVENOUS
  Filled 2023-09-30: qty 2

## 2023-09-30 MED ORDER — HYDROMORPHONE HCL 1 MG/ML IJ SOLN
INTRAMUSCULAR | Status: DC | PRN
  Administered 2023-09-30: 1 mg via INTRAVENOUS

## 2023-09-30 MED ORDER — FENTANYL CITRATE (PF) 100 MCG/2ML IJ SOLN
INTRAMUSCULAR | Status: AC
  Filled 2023-09-30: qty 2

## 2023-09-30 MED ORDER — DEXMEDETOMIDINE HCL IN NACL 80 MCG/20ML IV SOLN
INTRAVENOUS | Status: DC | PRN
Start: 2023-09-30 — End: 2023-09-30
  Administered 2023-09-30: 8 ug via INTRAVENOUS
  Administered 2023-09-30 (×2): 4 ug via INTRAVENOUS

## 2023-09-30 MED ORDER — PIPERACILLIN-TAZOBACTAM 3.375 G IVPB
3.3750 g | Freq: Three times a day (TID) | INTRAVENOUS | Status: DC
  Administered 2023-09-30: 3.375 g via INTRAVENOUS
  Filled 2023-09-30: qty 50

## 2023-09-30 MED ORDER — OXYCODONE HCL 5 MG PO TABS: 5.0000 mg | ORAL_TABLET | Freq: Once | ORAL | Status: DC | PRN

## 2023-09-30 MED ORDER — FENTANYL CITRATE (PF) 100 MCG/2ML IJ SOLN
INTRAMUSCULAR | Status: DC | PRN
  Administered 2023-09-30: 100 ug via INTRAVENOUS

## 2023-09-30 MED ORDER — NALOXONE HCL 2 MG/2ML IJ SOSY
PREFILLED_SYRINGE | INTRAMUSCULAR | Status: AC
  Filled 2023-09-30: qty 2

## 2023-09-30 MED ORDER — DEXAMETHASONE SODIUM PHOSPHATE 10 MG/ML IJ SOLN
INTRAMUSCULAR | Status: AC
  Filled 2023-09-30: qty 1

## 2023-09-30 MED ORDER — LIDOCAINE HCL (CARDIAC) PF 100 MG/5ML IV SOSY
PREFILLED_SYRINGE | INTRAVENOUS | Status: DC | PRN
  Administered 2023-09-30: 50 mg via INTRAVENOUS

## 2023-09-30 MED ORDER — NALOXONE HCL 0.4 MG/ML IJ SOLN: 0.4000 mg | INTRAMUSCULAR | Status: DC | PRN

## 2023-09-30 MED ORDER — DEXAMETHASONE SODIUM PHOSPHATE 10 MG/ML IJ SOLN
INTRAMUSCULAR | Status: DC | PRN
  Administered 2023-09-30: 10 mg via INTRAVENOUS

## 2023-09-30 MED ORDER — CEFAZOLIN SODIUM-DEXTROSE 2-3 GM-%(50ML) IV SOLR
INTRAVENOUS | Status: DC | PRN
Start: 2023-09-30 — End: 2023-09-30
  Administered 2023-09-30: 2 g via INTRAVENOUS

## 2023-09-30 MED ORDER — IOHEXOL 300 MG/ML  SOLN
100.0000 mL | Freq: Once | INTRAMUSCULAR | Status: AC | PRN
  Administered 2023-09-30: 100 mL via INTRAVENOUS

## 2023-09-30 MED ORDER — SUGAMMADEX SODIUM 200 MG/2ML IV SOLN
INTRAVENOUS | Status: DC | PRN
  Administered 2023-09-30: 200 mg via INTRAVENOUS

## 2023-09-30 MED ORDER — ONDANSETRON HCL 4 MG/2ML IJ SOLN
INTRAMUSCULAR | Status: AC
  Filled 2023-09-30: qty 2

## 2023-09-30 MED ORDER — PIPERACILLIN-TAZOBACTAM 3.375 G IVPB 30 MIN
3.3750 g | Freq: Once | INTRAVENOUS | Status: AC
  Administered 2023-09-30: 3.375 g via INTRAVENOUS
  Filled 2023-09-30: qty 50

## 2023-09-30 MED ORDER — ROCURONIUM BROMIDE 100 MG/10ML IV SOLN
INTRAVENOUS | Status: DC | PRN
  Administered 2023-09-30: 10 mg via INTRAVENOUS
  Administered 2023-09-30: 50 mg via INTRAVENOUS

## 2023-09-30 MED ORDER — ONDANSETRON HCL 4 MG/2ML IJ SOLN: 4.0000 mg | Freq: Four times a day (QID) | INTRAMUSCULAR | Status: DC | PRN

## 2023-09-30 MED ORDER — MIDAZOLAM HCL 2 MG/2ML IJ SOLN
INTRAMUSCULAR | Status: AC
  Filled 2023-09-30: qty 2

## 2023-09-30 MED ORDER — HYDROMORPHONE HCL 1 MG/ML IJ SOLN
1.0000 mg | Freq: Once | INTRAMUSCULAR | Status: AC
  Administered 2023-09-30: 1 mg via INTRAVENOUS

## 2023-09-30 SURGICAL SUPPLY — 38 items
CABLE HIGH FREQUENCY MONO STRZ (ELECTRODE) ×1 IMPLANT
CLIP APPLIE ROT 10 11.4 M/L (STAPLE) IMPLANT
CUTTER FLEX LINEAR 45M (STAPLE) IMPLANT
DEFOGGER SCOPE WARM SEASHARP (MISCELLANEOUS) ×1 IMPLANT
DERMABOND ADVANCED .7 DNX12 (GAUZE/BANDAGES/DRESSINGS) ×1 IMPLANT
DRAIN CHANNEL JP 19F RND 3/16 (MISCELLANEOUS) IMPLANT
ELECTRODE REM PT RTRN 9FT ADLT (ELECTROSURGICAL) ×1 IMPLANT
EVACUATOR SILICONE 100CC (DRAIN) IMPLANT
GLOVE BIOGEL PI IND STRL 7.5 (GLOVE) ×1 IMPLANT
GLOVE SURG SYN 7.0 PF PI (GLOVE) ×1 IMPLANT
GOWN STRL REUS W/ TWL LRG LVL3 (GOWN DISPOSABLE) ×2 IMPLANT
GRASPER SUT TROCAR 14GX15 (MISCELLANEOUS) ×1 IMPLANT
IRRIGATION STRYKERFLOW (MISCELLANEOUS) IMPLANT
IV NS 1000ML BAXH (IV SOLUTION) IMPLANT
KIT PINK PAD W/HEAD ARM REST (MISCELLANEOUS) ×1 IMPLANT
KIT TURNOVER KIT A (KITS) ×1 IMPLANT
LIGASURE LAP MARYLAND 5MM 37CM (ELECTROSURGICAL) IMPLANT
MANIFOLD NEPTUNE II (INSTRUMENTS) ×1 IMPLANT
NDL HYPO 22X1.5 SAFETY MO (MISCELLANEOUS) ×1 IMPLANT
NDL INSUFFLATION 14GA 120MM (NEEDLE) ×1 IMPLANT
NEEDLE HYPO 22X1.5 SAFETY MO (MISCELLANEOUS) ×1 IMPLANT
NEEDLE INSUFFLATION 14GA 120MM (NEEDLE) ×1 IMPLANT
NS IRRIG 500ML POUR BTL (IV SOLUTION) ×1 IMPLANT
PACK LAP CHOLECYSTECTOMY (MISCELLANEOUS) ×1 IMPLANT
RELOAD STAPLE 45 2.5 WHT GRN (ENDOMECHANICALS) IMPLANT
RELOAD STAPLE 45 3.5 BLU ETS (ENDOMECHANICALS) IMPLANT
SCISSORS LAP 5X35 DISP (ENDOMECHANICALS) IMPLANT
SET TUBE SMOKE EVAC HIGH FLOW (TUBING) ×1 IMPLANT
SLEEVE Z-THREAD 5X100MM (TROCAR) ×1 IMPLANT
SUT ETHILON 2 0 FS 18 (SUTURE) IMPLANT
SUT MNCRL AB 4-0 PS2 18 (SUTURE) ×1 IMPLANT
SUT VICRYL 0 UR6 27IN ABS (SUTURE) ×1 IMPLANT
SYSTEM BAG RETRIEVAL 10MM (BASKET) ×1 IMPLANT
TRAP FLUID SMOKE EVACUATOR (MISCELLANEOUS) ×1 IMPLANT
TRAY FOLEY MTR SLVR 16FR STAT (SET/KITS/TRAYS/PACK) ×1 IMPLANT
TROCAR Z-THREAD FIOS 12X100MM (TROCAR) ×1 IMPLANT
TROCAR Z-THREAD FIOS 5X100MM (TROCAR) ×1 IMPLANT
WATER STERILE IRR 500ML POUR (IV SOLUTION) ×1 IMPLANT

## 2023-09-30 NOTE — H&P (Signed)
 Patient ID: Tyler Deleon, male   DOB: Sep 12, 1970, 53 y.o.   MRN: [id number] CC: Acute Appendicitis History of Present Illness Tyler Deleon is a 53 y.o. male with past medical history as below who presents in consultation for abdominal pain.  The patient reports that his abdominal pain started yesterday.  It was sharp and in the right lower quadrant.  This was associated with nausea and 1 episode of emesis.  The pain would not go away and worsened so he came to the emergency department for evaluation.  Here he was found to have.  And leukocytosis of 11,000 and he underwent a CT scan that was concerning for acute appendicitis with an appendicolith with.  On exam he reports continued abdominal pain that is only partially resolved with medications.  Past Medical History Past Medical History:  Diagnosis Date   Anxiety    Asthma    Cardiac angina (HCC)    Diabetes mellitus without complication [facility])        Past Surgical History:  Procedure Laterality Date   CARDIAC CATHETERIZATION N/A 12/27/2015   Procedure: Left Heart Cath and Coronary Angiography;  Surgeon: Lonni Hanson, MD;  Location: [facility] INVASIVE CV LAB;  Service: Cardiovascular;  Laterality: N/A;   EYE SURGERY Bilateral    cataract removals    Allergies  Allergen Reactions   Bupropion Other (See Comments)    Increased anger     Current Facility-Administered Medications  Medication Dose Route Frequency Provider Last Rate Last Admin   acetaminophen  (TYLENOL ) tablet 1,000 mg  1,000 mg Oral Q6H Marinda Jayson KIDD, MD   1,000 mg at 09/30/23 1107   lactated ringers  infusion   Intravenous Continuous Marinda Jayson KIDD, MD 75 mL/hr at 09/30/23 0607 New Bag at 09/30/23 0607   morphine  (PF) 2 MG/ML injection 2 mg  2 mg Intravenous Q2H PRN Marinda Jayson KIDD, MD   2 mg at 09/30/23 9372   ondansetron  (ZOFRAN -ODT) disintegrating tablet 4 mg  4 mg Oral Q6H PRN Marinda Jayson KIDD, MD       Or   ondansetron  (ZOFRAN ) injection 4 mg  4 mg Intravenous Q6H  PRN Marinda Jayson KIDD, MD       oxyCODONE  (Oxy IR/ROXICODONE ) immediate release tablet 5-10 mg  5-10 mg Oral Q4H PRN Marinda Jayson KIDD, MD   10 mg at 09/30/23 1006   piperacillin -tazobactam (ZOSYN ) IVPB 3.375 g  3.375 g Intravenous Q8H Marinda Jayson KIDD, MD 12.5 mL/hr at 09/30/23 1008 3.375 g at 09/30/23 1008   Current Outpatient Medications  Medication Sig Dispense Refill   artificial tears ophthalmic solution Place 2 drops into the right eye 3 (three) times daily.     atorvastatin (LIPITOR) 20 MG tablet Take 20 mg by mouth daily.     Calcium Polycarbophil (FIBER) 625 MG TABS Take 1 tablet by mouth 2 (two) times daily.     Cholecalciferol 50 MCG (2000 UT) TABS Take 1 tablet by mouth daily.     cyanocobalamin 100 MCG tablet Take 100 mcg by mouth daily.     diclofenac Sodium (VOLTAREN) 1 % GEL Apply topically 4 (four) times daily.     DULoxetine (CYMBALTA) 60 MG capsule Take 60 mg by mouth daily.     empagliflozin (JARDIANCE) 25 MG TABS tablet Take 25 mg by mouth daily.     gabapentin (NEURONTIN) 300 MG capsule Take 600-900 mg by mouth 3 (three) times daily. Take 900 mg in the morning, 600 mg at noon and 600 mg in  the evening     hydrocortisone 2.5 % lotion Apply 1 Application topically daily. APPLY AS DIRECTED TOPICALLY EVERY DAY SEBORRHEA APPLY TO RED, SCALY AREAS ON FACE ONCE DAILY     ketoconazole (NIZORAL) 2 % shampoo Apply 1 Application topically as directed. SHAMPOO AS DIRECTED TOPICALLY AS DIRECTED SEBORRHEA WASH FACE ONCE WEEKLY TO DAILY     ketotifen (ZADITOR) 0.035 % ophthalmic solution Place 1 drop into the right eye 2 (two) times daily.     Lactobacillus (PROBIOTIC ACIDOPHILUS PO) Take 2 capsules by mouth 2 (two) times daily.     lidocaine  (LIDODERM ) 5 % Place 1 patch onto the skin daily. APPLY 1 PATCH TO SKIN EVERY DAY APPLY ONCE DAILY FOR UP TO 12 HOURS (12 HOURS ON THEN REMOVE FOR 12 HOURS)     losartan (COZAAR) 25 MG tablet Take 25 mg by mouth daily. for high blood pressure      melatonin 3 MG TABS tablet Take 6 mg by mouth at bedtime.     metFORMIN  (GLUCOPHAGE -XR) 500 MG 24 hr tablet Take 500 mg by mouth 2 (two) times daily with a meal.     mirtazapine (REMERON) 15 MG tablet Take 15 mg by mouth at bedtime. FOR POSTTRAUMATIC STRESS DISORDER     Multiple Vitamin (MULTIVITAMIN) tablet Take 1 tablet by mouth daily.     mupirocin ointment (BACTROBAN) 2 % Apply 1 Application topically 2 (two) times daily. APPLY SMALL AMOUNT INTO NOSE TWO TIMES A DAY IMPETIGO APPLY TWICE DAILY FOR 5-7 DAYS     naloxone  (NARCAN ) nasal spray 4 mg/0.1 mL Place 1 spray into the nose as needed. 1 SPRAY INTO NOSE AS NEEDED OPIOID OVERDOSE **FOR EMERGENCY USE ONLY!** ADMINISTER A SINGLE SPRAY INTRANASALLY INTO ONE NOSTRIL AS NEEDED FOR OPIOID OVERDOSE. MAY REPEAT DOSE ONCE IN 2 TO 3 MINUTES OR IF STILL NOT BREATHING. CALL 911 AFTER ADMINISTRATION.     naproxen (NAPROSYN) 500 MG tablet Take 500 mg by mouth 2 (two) times daily with a meal.     nicotine (NICODERM CQ - DOSED IN MG/24 HOURS) 21 mg/24hr patch Place 21 mg onto the skin daily. APPLY ONE PATCH TO SKIN EVERY DAY FOR SMOKING CESSATION APPLY ONE PATCH WHEN YOU WAKE UP AND REMOVE THE OLD PATCH. PEEL THE BACK OFF THE PATCH AND PUT IT ON CLEAN,DRY, HAIR-FREE SKIN ON THE UPPER ARM, CHEST OR BACK     nicotine polacrilex (COMMIT) 4 MG lozenge Take 4 mg by mouth as needed for smoking cessation. DISSOLVE 1 LOZENGE BY MOUTH AS NEEDED FOR SMOKING CESSATION USE AT LEAST 8-10 LOZENGES EACH DAY TO START. DO NOT USE MORE THAN 20 LOZENGES IN A DAY. DO NOT EAT OR DRINK FOR 15 MINUTES BEFORE AND DURING USE     Omega-3 Fatty Acids (FISH OIL) 1000 MG CAPS Take 1 capsule by mouth daily.     divalproex  (DEPAKOTE  ER) 500 MG 24 hr tablet TAKE 1 TABLET BY MOUTH AT BEDTIME (Patient not taking: Reported on 09/30/2023) 90 tablet 0   ibuprofen (ADVIL) 200 MG tablet Take 200 mg by mouth every 6 (six) hours as needed. (Patient not taking: Reported on 09/30/2023)     QUEtiapine   (SEROQUEL ) 100 MG tablet TAKE 1 AND 1/2 TABLETS BY MOUTH AT BEDTIME (Patient not taking: Reported on 09/30/2023) 45 tablet 0   venlafaxine  XR (EFFEXOR -XR) 75 MG 24 hr capsule TAKE 3 CAPSULES BY MOUTH AT BEDTIME (Patient not taking: Reported on 09/30/2023) 270 capsule 0    Family History Family History  Problem Relation Age of Onset   Diabetes Mother    Hepatitis C Mother    Multiple sclerosis Mother    Drug abuse Mother        Social History Social History   Tobacco Use   Smoking status: Former    Types: Cigarettes   Smokeless tobacco: Current    Types: Chew   Tobacco comments:    was an occasional smoker, never a pack a day smoker  Vaping Use   Vaping status: Never Used  Substance Use Topics   Alcohol use: Yes    Comment: occasional    Drug use: No        ROS Full ROS of systems performed and is otherwise negative there than what is stated in the HPI  Physical Exam Blood pressure 124/70, pulse (!) 58, temperature 98 F (36.7 C), resp. rate 18, height 5' 11 (1.803 m), weight 90.3 kg, SpO2 95%.  Alert and oriented x 3, no work of breathing on room air, intermittent sinus bradycardia, moving all extremities spontaneously, abdomen is soft, tender to palpation in the left lower quadrant in the right lower quadrant with some voluntary guarding in the right lower quadrant, no surgical scars on his abdomen.  Data Reviewed Labs reviewed and consistent with a leukocytosis of 11,000.  I independently reviewed his CT scan.  CT scan concerning for acute appendicitis with inflammation in the right lower quadrant and inflamed appendix with appendicolith  I have personally reviewed the patient's imaging and medical records.    Assessment    53 year old with clinical and radiographic evidence of acute appendicitis  Plan    Plan for laparoscopic appendectomy today.  I discussed the risk, benefits alternatives to the procedure including risk of infection, bleeding, abscess  formation, need for open procedure and, damage to the surrounding structures including bowel and large bowel.  I did discuss with them that if there is evidence of perforation he would need to stay the night.  He understands these and wishes to proceed with surgery.    Jayson MALVA Endow 09/30/2023, 11:18 AM

## 2023-09-30 NOTE — Anesthesia Preprocedure Evaluation (Signed)
 Anesthesia Evaluation  Patient identified by MRN, date of birth, ID band Patient awake    Reviewed: Allergy & Precautions, NPO status , Patient's Chart, lab work & pertinent test results  History of Anesthesia Complications Negative for: history of anesthetic complications  Airway Mallampati: III  TM Distance: >3 FB Neck ROM: full    Dental no notable dental hx.    Pulmonary asthma , Patient abstained from smoking., former smoker   Pulmonary exam normal        Cardiovascular hypertension, On Medications Normal cardiovascular exam     Neuro/Psych negative neurological ROS  negative psych ROS   GI/Hepatic negative GI ROS, Neg liver ROS,,,  Endo/Other  negative endocrine ROSdiabetes, Type 2, Oral Hypoglycemic Agents    Renal/GU      Musculoskeletal   Abdominal   Peds  Hematology negative hematology ROS (+)   Anesthesia Other Findings Past Medical History: No date: Anxiety No date: Asthma No date: Cardiac angina (HCC) No date: Diabetes mellitus without complication [facility])  Past Surgical History: 12/27/2015: CARDIAC CATHETERIZATION; N/A     Comment:  Procedure: Left Heart Cath and Coronary Angiography;                Surgeon: Lonni Hanson, MD;  Location: [facility] INVASIVE CV              LAB;  Service: Cardiovascular;  Laterality: N/A; No date: EYE SURGERY; Bilateral     Comment:  cataract removals  BMI    Body Mass Index: 27.75 kg/m      Reproductive/Obstetrics negative OB ROS                              Anesthesia Physical Anesthesia Plan  ASA: 2  Anesthesia Plan: General ETT   Post-op Pain Management: Toradol  IV (intra-op)*, Ofirmev  IV (intra-op)* and Dilaudid  IV   Induction: Intravenous  PONV Risk Score and Plan: 2 and Ondansetron , Dexamethasone , Midazolam  and Treatment may vary due to age or medical condition  Airway Management Planned: Oral ETT  Additional  Equipment:   Intra-op Plan:   Post-operative Plan: Extubation in OR  Informed Consent: I have reviewed the patients History and Physical, chart, labs and discussed the procedure including the risks, benefits and alternatives for the proposed anesthesia with the patient or authorized representative who has indicated his/her understanding and acceptance.     Dental Advisory Given  Plan Discussed with: Anesthesiologist, CRNA and Surgeon  Anesthesia Plan Comments: (Patient consented for risks of anesthesia including but not limited to:  - adverse reactions to medications - damage to eyes, teeth, lips or other oral mucosa - nerve damage due to positioning  - sore throat or hoarseness - Damage to heart, brain, nerves, lungs, other parts of body or loss of life  Patient voiced understanding and assent.)        Anesthesia Quick Evaluation

## 2023-09-30 NOTE — Progress Notes (Signed)
 Patient arrived to PACU with gray colored face, an oral airway and simple mask. Patient became apneic and unresponsive. Ambu bag placed over patient and jaw thrust performed by CRNA. Dr. Dario notified and called to patient's bedside. Patient oxygen began to increase and patient became more responsive. Patient began to open his eyes and simple mask was placed back on patient.

## 2023-09-30 NOTE — Op Note (Signed)
 Operative note  Preoperative diagnosis: Acute appendicitis Postoperative diagnosis: Acute appendicitis Surgeon: Jayson Hobby, MD EBL: 10 cc Procedure laparoscopic appendectomy   After informed consent was obtained the patient was brought to the operating room placed supine on the operating room table.  General endotracheal anesthesia was then induced and his abdomen was then prepped and draped in the usual sterile fashion.  A surgical timeout was called identifying correct patient, site, side and procedure.  An incision was made in the left upper quadrant at Palmer's point and a Veress needle was inserted into the abdomen using standard drop technique.  Pneumoperitoneum was then established.  A 5 mm infraumbilical Optiview trocar was then placed under direct visualization.  There was noted to be no injury at the site of Veress needle insertion.  An additional left lower quadrant 12 mm port and 5 mm suprapubic port were then placed under direct visualization.  Small bowel was swept out of the right lower quadrant.  The appendiceal base did appear inflamed.  There was no evidence of perforation of the appendix.  The mesoappendix was grasped and the appendix was elevated towards the anterior abdominal wall.  Given that there was an appendicolith at the base of the appendix it was difficult to make a window so I elected to take the mesoappendix with a LigaSure device.  The appendiceal artery was identified and cauterized and ligated with the LigaSure device.  The mesoappendix was taken towards the base of the appendix.  This left just the base.  I was able to come across the base of the appendix with an 45 mm Endo GIA blue load stapler.  The base was taken with 2 loads of that stapler.  The appendix was then placed in an Endo Catch bag.  The right lower quadrant was suctioned out of any blood and hemostasis was ensured.  The staple lines were examined and were seen to be intact.  The Endo Catch bag was removed  through the 12 mm left lower quadrant incision.  The fascia of the 12 mm port was then closed with 0 Vicryl on the suture needle passer.  The abdomen was then allowed to be desufflated.  The skin was closed with 4-0 Monocryl and dressed with glue.  Prior to termination of the procedure all sponge and instrument counts were correct x 2.

## 2023-09-30 NOTE — ED Triage Notes (Signed)
 Patient ambulatory to triage with complaints of RLQ abdominal pain since 2pm. Patient states it just keeps getting worse, endorses vomiting, fevers unknown. Patient does have hx of kidney stones but states this pain is way different and is concerned for appendicitis.

## 2023-09-30 NOTE — ED Provider Notes (Signed)
 [facility] Provider Note    Event Date/Time   First MD Initiated Contact with Patient 09/30/23 [phone]     (approximate)   History   Abdominal Pain   HPI Tyler Deleon is a 53 y.o. male who presents for evaluation of right lower quadrant abdominal pain.  He says it started about 2 PM yesterday (approximately 15 hours before my assessment) and started fairly strong.  It is gradual gotten worse.  He has had nausea and vomiting.  No dysuria and he said he has had kidney stones but this feels very different.  Pain is worse with movement so he is trying to hold still as much as possible.  No prior abdominal surgery.     Physical Exam   Triage Vital Signs: ED Triage Vitals  Encounter Vitals Group     BP 09/30/23 0222 (!) 100/59     Girls Systolic BP Percentile --      Girls Diastolic BP Percentile --      Boys Systolic BP Percentile --      Boys Diastolic BP Percentile --      Pulse Rate 09/30/23 0222 64     Resp 09/30/23 0222 18     Temp 09/30/23 0222 97.6 F (36.4 C)     Temp Source 09/30/23 0222 Oral     SpO2 09/30/23 0222 97 %     Weight 09/30/23 0223 90.3 kg (199 lb)     Height 09/30/23 0223 1.803 m (5' 11)     Head Circumference --      Peak Flow --      Pain Score 09/30/23 0223 8     Pain Loc --      Pain Education --      Exclude from Growth Chart --     Most recent vital signs: Vitals:   09/30/23 0222  BP: (!) 100/59  Pulse: 64  Resp: 18  Temp: 97.6 F (36.4 C)  SpO2: 97%    General: Awake, appears to be in pain but nontoxic. CV:  Good peripheral perfusion.  Regular rate and rhythm. Resp:  Normal effort. Speaking easily and comfortably, no accessory muscle usage nor intercostal retractions.   Abd:  Soft.  Exquisite tenderness to palpation with guarding in the right lower quadrant.  Positive Rovsing's sign.   ED Results / Procedures / Treatments   Labs (all labs ordered are listed, but only abnormal results are displayed) Labs  Reviewed  COMPREHENSIVE METABOLIC PANEL WITH GFR - Abnormal; Notable for the following components:      Result Value   Glucose, Bld 133 (*)    BUN 25 (*)    Calcium 8.5 (*)    All other components within normal limits  CBC - Abnormal; Notable for the following components:   WBC 11.7 (*)    All other components within normal limits  LIPASE, BLOOD  HIV ANTIBODY (ROUTINE TESTING W REFLEX)      RADIOLOGY See ED course for details   PROCEDURES:  Critical Care performed: No  Procedures    IMPRESSION / MDM / ASSESSMENT AND PLAN / ED COURSE  I reviewed the triage vital signs and the nursing notes.                              Differential diagnosis includes, but is not limited to, appendicitis, mesenteric adenitis, diverticulitis, less likely ureteral colic.  Patient's presentation is most  consistent with acute presentation with potential threat to life or bodily function.  Labs/studies ordered: CT abdomen pelvis, CMP, lipase, CBC  Interventions/Medications given:  Medications  piperacillin -tazobactam (ZOSYN ) IVPB 3.375 g (3.375 g Intravenous New Bag/Given 09/30/23 0444)  lactated ringers  infusion (has no administration in time range)  piperacillin -tazobactam (ZOSYN ) IVPB 3.375 g (has no administration in time range)  acetaminophen  (TYLENOL ) tablet 1,000 mg (has no administration in time range)  oxyCODONE  (Oxy IR/ROXICODONE ) immediate release tablet 5-10 mg (has no administration in time range)  morphine  (PF) 2 MG/ML injection 2 mg (has no administration in time range)  ondansetron  (ZOFRAN -ODT) disintegrating tablet 4 mg (has no administration in time range)    Or  ondansetron  (ZOFRAN ) injection 4 mg (has no administration in time range)  morphine  (PF) 4 MG/ML injection 4 mg (4 mg Intravenous Given 09/30/23 0329)  ondansetron  (ZOFRAN ) injection 4 mg (4 mg Intravenous Given 09/30/23 0329)  iohexol  (OMNIPAQUE ) 300 MG/ML solution 100 mL (100 mLs Intravenous Contrast Given  09/30/23 0405)  lactated ringers  bolus 1,000 mL (1,000 mLs Intravenous New Bag/Given 09/30/23 0500)  morphine  (PF) 4 MG/ML injection 4 mg (4 mg Intravenous Given 09/30/23 0459)  ondansetron  (ZOFRAN ) injection 4 mg (4 mg Intravenous Given 09/30/23 0457)    (Note:  hospital course my include additional interventions and/or labs/studies not listed above.)   I independently viewed and interpreted the patient CT scan and he has inflammation in the right lower quadrant consistent with appendicitis.  Radiologist confirmed approximately 9 mm appendicolith with inflammation consistent with uncomplicated appendicitis, no evidence of perforation or abscess.  Patient has already received 1 dose of morphine  4 mg IV and Zofran  4 mg IV and I am ordering him a second dose of each due to persistent pain.  I ordered 1 L LR and Zosyn  3.375 g IV for empiric antibiotics.  Patient will require surgery and I am consulting general surgery.  Patient understands and agrees with the plan     Clinical Course as of 09/30/23 0507  Mon Sep 30, 2023  [id number] Paged Dr. Marinda with general surgery [CF]  [phone] Consulted with Dr. Marinda with surgery who will admit the patient [CF]    Clinical Course User Index [CF] Gordan Huxley, MD     FINAL CLINICAL IMPRESSION(S) / ED DIAGNOSES   Final diagnoses:  Acute appendicitis with localized peritonitis, without perforation, abscess, or gangrene     Rx / DC Orders   ED Discharge Orders     None        Note:  This document was prepared using Dragon voice recognition software and may include unintentional dictation errors.   Gordan Huxley, MD 09/30/23 [phone]

## 2023-09-30 NOTE — ED Notes (Signed)
 See triage note  states the PA informed him that the surgery will be around PM   The pain is 8/10 at present   Additional pain meds given

## 2023-09-30 NOTE — Transfer of Care (Signed)
 Immediate Anesthesia Transfer of Care Note  Patient: Tyler Deleon  Procedure(s) Performed: APPENDECTOMY, LAPAROSCOPIC  Patient Location: PACU  Anesthesia Type:General  Level of Consciousness: awake, alert , oriented, and patient cooperative  Airway & Oxygen Therapy: Patient Spontanous Breathing and Patient connected to face mask oxygen  Post-op Assessment: Report given to RN, Patient moving all extremities, and Patient moving all extremities X 4  Post vital signs: Reviewed and stable  Last Vitals:  Vitals Value Taken Time  BP 184/90 09/30/23 16:30  Temp    Pulse 89 09/30/23 16:39  Resp 17 09/30/23 16:39  SpO2 93 % 09/30/23 16:39  Vitals shown include unfiled device data.  Last Pain:  Vitals:   09/30/23 1304  TempSrc:   PainSc: 8      Pt was sleepy. VSS.     Complications: No notable events documented.

## 2023-09-30 NOTE — ED Notes (Signed)
 Standing in room  States pain is increased with movement

## 2023-09-30 NOTE — Anesthesia Procedure Notes (Signed)
 Procedure Name: Intubation Date/Time: 09/30/2023 3:06 PM  Performed by: Myra Lawless, CRNAPre-anesthesia Checklist: Patient identified, Patient being monitored, Timeout performed, Emergency Drugs available and Suction available Patient Re-evaluated:Patient Re-evaluated prior to induction Oxygen Delivery Method: Circle system utilized Preoxygenation: Pre-oxygenation with 100% oxygen Induction Type: IV induction Ventilation: Mask ventilation without difficulty Laryngoscope Size: Mac and 4 Grade View: Grade I Tube type: Oral Tube size: 7.5 mm Number of attempts: 1 Airway Equipment and Method: Stylet Placement Confirmation: ETT inserted through vocal cords under direct vision, positive ETCO2 and breath sounds checked- equal and bilateral Secured at: 21 cm Tube secured with: Tape Dental Injury: Teeth and Oropharynx as per pre-operative assessment

## 2023-09-30 NOTE — Discharge Summary (Signed)
 Patient ID: Tyler Deleon MRN: [id number] DOB/AGE: Apr 30, 1970 53 y.o.  Admit date: 09/30/2023 Discharge date: 09/30/2023   Discharge Diagnoses:  Principal Problem:   Acute appendicitis   Procedures:lap appy  Hospital Course:   admitted with findings consistent with acute appendicitis and  was taken promptly to the operating room for an uneventful laparoscopic appendecotmy.  Patient was kept overnight.  The time of discharge the patient was ambulating,  pain was controlled.  Her vital signs were stable and she was afebrile.   physical exam at discharge showed a pt  in no acute distress.  Awake and alert.  Abdomen: Soft incisions healing well without infection or peritonitis.  Extremities well-perfused and no edema.  Condition of the patient the time of discharge was stable   Consults: None  Disposition: Discharge disposition: 01-Home or Self Care       Discharge Instructions     Call MD for:  difficulty breathing, headache or visual disturbances   Complete by: As directed    Call MD for:  extreme fatigue   Complete by: As directed    Call MD for:  hives   Complete by: As directed    Call MD for:  persistant dizziness or light-headedness   Complete by: As directed    Call MD for:  persistant nausea and vomiting   Complete by: As directed    Call MD for:  redness, tenderness, or signs of infection (pain, swelling, redness, odor or green/yellow discharge around incision site)   Complete by: As directed    Call MD for:  severe uncontrolled pain   Complete by: As directed    Call MD for:  temperature >100.4   Complete by: As directed    Diet - low sodium heart healthy   Complete by: As directed    Discharge instructions   Complete by: As directed    You have surgical glue over your incisions.  This will peel off over the next 3 to 4 weeks.  You may shower on postoperative day 1.  Please let the warm water run over your incisions and pat dry.  Please do not submerge the wounds  in water for 2 weeks.  Please avoid heavy lifting greater than 10 to 15 pounds for 4 weeks.  You may use Tylenol  and ibuprofen as needed for pain.  You may use ice to the incision.  She will follow-up with our office in 2 weeks   Increase activity slowly   Complete by: As directed       Allergies as of 09/30/2023       Reactions   Bupropion Other (See Comments)   Increased anger         Medication List     TAKE these medications    Advil 200 MG tablet Generic drug: ibuprofen Take 200 mg by mouth every 6 (six) hours as needed.   artificial tears ophthalmic solution Place 2 drops into the right eye 3 (three) times daily.   atorvastatin 20 MG tablet Commonly known as: LIPITOR Take 20 mg by mouth daily.   Cholecalciferol 50 MCG (2000 UT) Tabs Take 1 tablet by mouth daily.   cyanocobalamin 100 MCG tablet Take 100 mcg by mouth daily.   diclofenac Sodium 1 % Gel Commonly known as: VOLTAREN Apply topically 4 (four) times daily.   divalproex  500 MG 24 hr tablet Commonly known as: DEPAKOTE  ER TAKE 1 TABLET BY MOUTH AT BEDTIME   DULoxetine 60 MG capsule Commonly known  as: CYMBALTA Take 60 mg by mouth daily.   empagliflozin 25 MG Tabs tablet Commonly known as: JARDIANCE Take 25 mg by mouth daily.   Fiber 625 MG Tabs Take 1 tablet by mouth 2 (two) times daily.   Fish Oil 1000 MG Caps Take 1 capsule by mouth daily.   gabapentin 300 MG capsule Commonly known as: NEURONTIN Take 600-900 mg by mouth 3 (three) times daily. Take 900 mg in the morning, 600 mg at noon and 600 mg in the evening   hydrocortisone 2.5 % lotion Apply 1 Application topically daily. APPLY AS DIRECTED TOPICALLY EVERY DAY SEBORRHEA APPLY TO RED, SCALY AREAS ON FACE ONCE DAILY   ketoconazole 2 % shampoo Commonly known as: NIZORAL Apply 1 Application topically as directed. SHAMPOO AS DIRECTED TOPICALLY AS DIRECTED SEBORRHEA WASH FACE ONCE WEEKLY TO DAILY   ketotifen 0.035 % ophthalmic  solution Commonly known as: ZADITOR Place 1 drop into the right eye 2 (two) times daily.   lidocaine  5 % Commonly known as: LIDODERM  Place 1 patch onto the skin daily. APPLY 1 PATCH TO SKIN EVERY DAY APPLY ONCE DAILY FOR UP TO 12 HOURS (12 HOURS ON THEN REMOVE FOR 12 HOURS)   losartan 25 MG tablet Commonly known as: COZAAR Take 25 mg by mouth daily. for high blood pressure   melatonin 3 MG Tabs tablet Take 6 mg by mouth at bedtime.   metFORMIN  500 MG 24 hr tablet Commonly known as: GLUCOPHAGE -XR Take 500 mg by mouth 2 (two) times daily with a meal.   mirtazapine 15 MG tablet Commonly known as: REMERON Take 15 mg by mouth at bedtime. FOR POSTTRAUMATIC STRESS DISORDER   multivitamin tablet Take 1 tablet by mouth daily.   mupirocin ointment 2 % Commonly known as: BACTROBAN Apply 1 Application topically 2 (two) times daily. APPLY SMALL AMOUNT INTO NOSE TWO TIMES A DAY IMPETIGO APPLY TWICE DAILY FOR 5-7 DAYS   naloxone  4 MG/0.1ML Liqd nasal spray kit Commonly known as: NARCAN  Place 1 spray into the nose as needed. 1 SPRAY INTO NOSE AS NEEDED OPIOID OVERDOSE **FOR EMERGENCY USE ONLY!** ADMINISTER A SINGLE SPRAY INTRANASALLY INTO ONE NOSTRIL AS NEEDED FOR OPIOID OVERDOSE. MAY REPEAT DOSE ONCE IN 2 TO 3 MINUTES OR IF STILL NOT BREATHING. CALL 911 AFTER ADMINISTRATION.   naproxen 500 MG tablet Commonly known as: NAPROSYN Take 500 mg by mouth 2 (two) times daily with a meal.   nicotine 21 mg/24hr patch Commonly known as: NICODERM CQ - dosed in mg/24 hours Place 21 mg onto the skin daily. APPLY ONE PATCH TO SKIN EVERY DAY FOR SMOKING CESSATION APPLY ONE PATCH WHEN YOU WAKE UP AND REMOVE THE OLD PATCH. PEEL THE BACK OFF THE PATCH AND PUT IT ON CLEAN,DRY, HAIR-FREE SKIN ON THE UPPER ARM, CHEST OR BACK   nicotine polacrilex 4 MG lozenge Commonly known as: COMMIT Take 4 mg by mouth as needed for smoking cessation. DISSOLVE 1 LOZENGE BY MOUTH AS NEEDED FOR SMOKING CESSATION USE AT LEAST  8-10 LOZENGES EACH DAY TO START. DO NOT USE MORE THAN 20 LOZENGES IN A DAY. DO NOT EAT OR DRINK FOR 15 MINUTES BEFORE AND DURING USE   oxyCODONE  5 MG immediate release tablet Commonly known as: Oxy IR/ROXICODONE  Take 1 tablet (5 mg total) by mouth every 6 (six) hours as needed for severe pain (pain score 7-10).   PROBIOTIC ACIDOPHILUS PO Take 2 capsules by mouth 2 (two) times daily.   QUEtiapine  100 MG tablet Commonly known as: SEROQUEL   TAKE 1 AND 1/2 TABLETS BY MOUTH AT BEDTIME   venlafaxine  XR 75 MG 24 hr capsule Commonly known as: EFFEXOR -XR TAKE 3 CAPSULES BY MOUTH AT BEDTIME          Jayson Endow, M.D.

## 2023-10-01 ENCOUNTER — Encounter: Payer: Self-pay | Admitting: General Surgery

## 2023-10-01 ENCOUNTER — Telehealth: Payer: Self-pay | Admitting: General Surgery

## 2023-10-01 NOTE — Telephone Encounter (Signed)
 Pt called and said that he has an appt tomorrow (10-02-23) for a root canal but they said that the one that just did his surgery has to give them approval to do this.This needs to be in his My Chart. Pt # [phone]

## 2023-10-02 ENCOUNTER — Encounter: Payer: Self-pay | Admitting: *Deleted

## 2023-10-02 LAB — SURGICAL PATHOLOGY

## 2023-10-03 NOTE — Anesthesia Postprocedure Evaluation (Signed)
 Anesthesia Post Note  Patient: Tyler Deleon  Procedure(s) Performed: APPENDECTOMY, LAPAROSCOPIC  Patient location during evaluation: PACU Anesthesia Type: General Level of consciousness: awake and alert Pain management: pain level controlled Vital Signs Assessment: post-procedure vital signs reviewed and stable Respiratory status: spontaneous breathing, nonlabored ventilation, respiratory function stable and patient connected to nasal cannula oxygen Cardiovascular status: blood pressure returned to baseline and stable Postop Assessment: no apparent nausea or vomiting Anesthetic complications: no   No notable events documented.   Last Vitals:  Vitals:   09/30/23 1730 09/30/23 1746  BP: (!) 105/59 109/68  Pulse: 72 94  Resp: 18 18  Temp: 36.8 C 37 C  SpO2: 93% 94%    Last Pain:  Vitals:   09/30/23 1746  TempSrc: Temporal  PainSc:                  Prentice Murphy

## 2023-10-16 ENCOUNTER — Ambulatory Visit: Payer: Self-pay | Admitting: Physician Assistant

## 2023-10-16 ENCOUNTER — Encounter: Payer: Self-pay | Admitting: Physician Assistant

## 2023-10-16 VITALS — BP 139/92 | HR 71 | Temp 98.3°F | Ht 71.0 in | Wt 202.4 lb

## 2023-10-16 DIAGNOSIS — Z09 Encounter for follow-up examination after completed treatment for conditions other than malignant neoplasm: Secondary | ICD-10-CM

## 2023-10-16 DIAGNOSIS — K353 Acute appendicitis with localized peritonitis, without perforation or gangrene: Secondary | ICD-10-CM

## 2023-10-16 DIAGNOSIS — K3580 Unspecified acute appendicitis: Secondary | ICD-10-CM

## 2023-10-16 NOTE — Patient Instructions (Signed)

## 2023-10-16 NOTE — Progress Notes (Signed)
 [facility] POST-OP OFFICE VISIT  10/16/2023  HPI: Tyler Deleon is a 53 y.o. male 16 days s/p laparoscopic appendectomy for acute appendicitis  He is overall doing well He is anxious to return to work Soreness at camera port site; worse with movement/sneezing He only took pain medications for 1 day Biggest issue is constipation; needing OTC laxative to go  No fever, chills, nausea, emesis Tolerating PO Incisions are healing well No other complaints   Vital signs: BP (!) 139/92   Pulse 71   Temp 98.3 F (36.8 C) (Oral)   Ht 5' 11 (1.803 m)   Wt 202 lb 6.4 oz (91.8 kg)   SpO2 97%   BMI 28.23 kg/m    Physical Exam: Constitutional: Well appearing male, NAD Abdomen: Soft, non-tender, non-distended, no rebound/guarding Skin: Laparoscopic incisions are healing well, no erythema or drainage   Assessment/Plan: This is a 53 y.o. male 16 days s/p laparoscopic appendectomy for acute appendicitis   - Explained expected soreness at camera port given need for fascial closure; This should resolve with time   - Pain control prn; OTC medications are sufficient  - Encouraged gentle bowel regimen with daily Miralax; titrate as needed. Monitor for resolution  - Reviewed wound care recommendation  - Reviewed lifting restrictions; 4 weeks total  - Reviewed surgical pathology; Appendicitis   - He can follow up on as needed basis; He understands to call with questions/concerns  -- Arthea Platt, PA-C  [facility] 10/16/2023, 2:51 PM M-F: 7am - 4pm
